# Patient Record
Sex: Female | Born: 1935
Health system: Southern US, Community
[De-identification: ages and names within clinical notes are randomized; demographics above are authoritative.]

## PROBLEM LIST (undated history)

## (undated) DIAGNOSIS — T8859XA Other complications of anesthesia, initial encounter: Secondary | ICD-10-CM

## (undated) DIAGNOSIS — I10 Essential (primary) hypertension: Secondary | ICD-10-CM

## (undated) DIAGNOSIS — E119 Type 2 diabetes mellitus without complications: Secondary | ICD-10-CM

## (undated) DIAGNOSIS — T4145XA Adverse effect of unspecified anesthetic, initial encounter: Secondary | ICD-10-CM

---
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
## 2019-08-13 ENCOUNTER — Encounter (HOSPITAL_COMMUNITY): Payer: Self-pay | Admitting: *Deleted

## 2019-08-13 ENCOUNTER — Inpatient Hospital Stay (HOSPITAL_COMMUNITY)
Admission: EM | Admit: 2019-08-13 | Discharge: 2019-08-18 | DRG: 389 | Disposition: A | Discharge status: Home Health | Payer: Medicare Other | Attending: Internal Medicine | Admitting: Internal Medicine

## 2019-08-13 ENCOUNTER — Other Ambulatory Visit: Payer: Self-pay

## 2019-08-13 ENCOUNTER — Emergency Department (HOSPITAL_COMMUNITY): Payer: Medicare Other

## 2019-08-13 DIAGNOSIS — Z7982 Long term (current) use of aspirin: Secondary | ICD-10-CM

## 2019-08-13 DIAGNOSIS — R140 Abdominal distension (gaseous): Secondary | ICD-10-CM

## 2019-08-13 DIAGNOSIS — Z66 Do not resuscitate: Secondary | ICD-10-CM | POA: Diagnosis present

## 2019-08-13 DIAGNOSIS — K56699 Other intestinal obstruction unspecified as to partial versus complete obstruction: Principal | ICD-10-CM | POA: Diagnosis present

## 2019-08-13 DIAGNOSIS — N179 Acute kidney failure, unspecified: Secondary | ICD-10-CM | POA: Diagnosis present

## 2019-08-13 DIAGNOSIS — Z7984 Long term (current) use of oral hypoglycemic drugs: Secondary | ICD-10-CM | POA: Diagnosis not present

## 2019-08-13 DIAGNOSIS — K635 Polyp of colon: Secondary | ICD-10-CM | POA: Diagnosis present

## 2019-08-13 DIAGNOSIS — D509 Iron deficiency anemia, unspecified: Secondary | ICD-10-CM | POA: Diagnosis present

## 2019-08-13 DIAGNOSIS — K56609 Unspecified intestinal obstruction, unspecified as to partial versus complete obstruction: Secondary | ICD-10-CM

## 2019-08-13 DIAGNOSIS — E1122 Type 2 diabetes mellitus with diabetic chronic kidney disease: Secondary | ICD-10-CM | POA: Diagnosis present

## 2019-08-13 DIAGNOSIS — Z7189 Other specified counseling: Secondary | ICD-10-CM | POA: Diagnosis not present

## 2019-08-13 DIAGNOSIS — D649 Anemia, unspecified: Secondary | ICD-10-CM

## 2019-08-13 DIAGNOSIS — Z90710 Acquired absence of both cervix and uterus: Secondary | ICD-10-CM

## 2019-08-13 DIAGNOSIS — N189 Chronic kidney disease, unspecified: Secondary | ICD-10-CM | POA: Diagnosis present

## 2019-08-13 DIAGNOSIS — Z79899 Other long term (current) drug therapy: Secondary | ICD-10-CM | POA: Diagnosis not present

## 2019-08-13 DIAGNOSIS — E119 Type 2 diabetes mellitus without complications: Secondary | ICD-10-CM

## 2019-08-13 DIAGNOSIS — Z833 Family history of diabetes mellitus: Secondary | ICD-10-CM

## 2019-08-13 DIAGNOSIS — Z20828 Contact with and (suspected) exposure to other viral communicable diseases: Secondary | ICD-10-CM | POA: Diagnosis present

## 2019-08-13 DIAGNOSIS — I129 Hypertensive chronic kidney disease with stage 1 through stage 4 chronic kidney disease, or unspecified chronic kidney disease: Secondary | ICD-10-CM | POA: Diagnosis present

## 2019-08-13 DIAGNOSIS — N289 Disorder of kidney and ureter, unspecified: Secondary | ICD-10-CM | POA: Diagnosis not present

## 2019-08-13 DIAGNOSIS — Z87891 Personal history of nicotine dependence: Secondary | ICD-10-CM

## 2019-08-13 DIAGNOSIS — Z515 Encounter for palliative care: Secondary | ICD-10-CM | POA: Diagnosis present

## 2019-08-13 DIAGNOSIS — N839 Noninflammatory disorder of ovary, fallopian tube and broad ligament, unspecified: Secondary | ICD-10-CM | POA: Diagnosis present

## 2019-08-13 DIAGNOSIS — Z90721 Acquired absence of ovaries, unilateral: Secondary | ICD-10-CM | POA: Diagnosis not present

## 2019-08-13 DIAGNOSIS — N2889 Other specified disorders of kidney and ureter: Secondary | ICD-10-CM | POA: Diagnosis present

## 2019-08-13 DIAGNOSIS — K5730 Diverticulosis of large intestine without perforation or abscess without bleeding: Secondary | ICD-10-CM | POA: Diagnosis present

## 2019-08-13 DIAGNOSIS — N390 Urinary tract infection, site not specified: Secondary | ICD-10-CM | POA: Diagnosis present

## 2019-08-13 DIAGNOSIS — R42 Dizziness and giddiness: Secondary | ICD-10-CM | POA: Diagnosis not present

## 2019-08-13 DIAGNOSIS — D72829 Elevated white blood cell count, unspecified: Secondary | ICD-10-CM

## 2019-08-13 DIAGNOSIS — C189 Malignant neoplasm of colon, unspecified: Secondary | ICD-10-CM

## 2019-08-13 HISTORY — DX: Type 2 diabetes mellitus without complications: E11.9

## 2019-08-13 HISTORY — DX: Other complications of anesthesia, initial encounter: T88.59XA

## 2019-08-13 HISTORY — DX: Essential (primary) hypertension: I10

## 2019-08-13 LAB — COMPREHENSIVE METABOLIC PANEL
ALT: 20 U/L (ref 0–44)
AST: 19 U/L (ref 15–41)
Albumin: 3.1 g/dL — ABNORMAL LOW (ref 3.5–5.0)
Alkaline Phosphatase: 47 U/L (ref 38–126)
Anion gap: 15 (ref 5–15)
BUN: 38 mg/dL — ABNORMAL HIGH (ref 8–23)
CO2: 22 mmol/L (ref 22–32)
Calcium: 8.6 mg/dL — ABNORMAL LOW (ref 8.9–10.3)
Chloride: 98 mmol/L (ref 98–111)
Creatinine, Ser: 2.24 mg/dL — ABNORMAL HIGH (ref 0.44–1.00)
GFR calc Af Amer: 22 mL/min — ABNORMAL LOW (ref >60)
GFR calc non Af Amer: 19 mL/min — ABNORMAL LOW (ref >60)
Glucose, Bld: 324 mg/dL — ABNORMAL HIGH (ref 70–99)
Potassium: 4.2 mmol/L (ref 3.5–5.1)
Sodium: 135 mmol/L (ref 135–145)
Total Bilirubin: 0.8 mg/dL (ref 0.3–1.2)
Total Protein: 6.3 g/dL — ABNORMAL LOW (ref 6.5–8.1)

## 2019-08-13 LAB — CBG MONITORING, ED: Glucose-Capillary: 281 mg/dL — ABNORMAL HIGH (ref 70–99)

## 2019-08-13 LAB — CBC
HCT: 32 % — ABNORMAL LOW (ref 36.0–46.0)
Hemoglobin: 9.3 g/dL — ABNORMAL LOW (ref 12.0–15.0)
MCH: 23.5 pg — ABNORMAL LOW (ref 26.0–34.0)
MCHC: 29.1 g/dL — ABNORMAL LOW (ref 30.0–36.0)
MCV: 80.8 fL (ref 80.0–100.0)
Platelets: 295 10*3/uL (ref 150–400)
RBC: 3.96 MIL/uL (ref 3.87–5.11)
RDW: 18.2 % — ABNORMAL HIGH (ref 11.5–15.5)
WBC: 12.2 10*3/uL — ABNORMAL HIGH (ref 4.0–10.5)
nRBC: 0 % (ref 0.0–0.2)

## 2019-08-13 LAB — URINALYSIS, ROUTINE W REFLEX MICROSCOPIC
Bacteria, UA: NONE SEEN
Bilirubin Urine: NEGATIVE
Glucose, UA: >=500 mg/dL — AB
Hgb urine dipstick: NEGATIVE
Ketones, ur: 5 mg/dL — AB
Nitrite: POSITIVE — AB
Protein, ur: 100 mg/dL — AB
Specific Gravity, Urine: 1.023 (ref 1.005–1.030)
pH: 5 (ref 5.0–8.0)

## 2019-08-13 LAB — LIPASE, BLOOD: Lipase: 23 U/L (ref 11–51)

## 2019-08-13 MED ORDER — PIPERACILLIN-TAZOBACTAM 4.5 G IVPB
4.5000 g | Freq: Once | INTRAVENOUS | Status: AC
  Administered 2019-08-13: 4.5 g via INTRAVENOUS
  Filled 2019-08-13: qty 100

## 2019-08-13 MED ORDER — PIPERACILLIN-TAZOBACTAM 4.5 G IVPB: 4.5000 g | Freq: Once | INTRAVENOUS | Status: DC

## 2019-08-13 MED ORDER — ACETAMINOPHEN 325 MG PO TABS
650.0000 mg | ORAL_TABLET | Freq: Four times a day (QID) | ORAL | Status: DC | PRN
  Administered 2019-08-13 – 2019-08-14 (×2): 650 mg via ORAL
  Filled 2019-08-13 (×2): qty 2

## 2019-08-13 MED ORDER — ENOXAPARIN SODIUM 30 MG/0.3ML ~~LOC~~ SOLN
30.0000 mg | SUBCUTANEOUS | Status: DC
  Administered 2019-08-14 – 2019-08-15 (×2): 30 mg via SUBCUTANEOUS
  Filled 2019-08-13 (×2): qty 0.3

## 2019-08-13 MED ORDER — CEPHALEXIN 250 MG PO CAPS
250.0000 mg | ORAL_CAPSULE | Freq: Two times a day (BID) | ORAL | Status: DC
  Administered 2019-08-13 – 2019-08-14 (×2): 250 mg via ORAL
  Filled 2019-08-13 (×4): qty 1

## 2019-08-13 MED ORDER — MORPHINE SULFATE (PF) 4 MG/ML IV SOLN
4.0000 mg | Freq: Once | INTRAVENOUS | Status: AC
  Administered 2019-08-13: 4 mg via INTRAVENOUS
  Filled 2019-08-13: qty 1

## 2019-08-13 MED ORDER — ONDANSETRON HCL 4 MG/2ML IJ SOLN: 4.0000 mg | Freq: Four times a day (QID) | INTRAMUSCULAR | Status: DC | PRN

## 2019-08-13 MED ORDER — ONDANSETRON HCL 4 MG PO TABS: 4.0000 mg | ORAL_TABLET | Freq: Four times a day (QID) | ORAL | Status: DC | PRN

## 2019-08-13 MED ORDER — SIMVASTATIN 20 MG PO TABS
40.0000 mg | ORAL_TABLET | Freq: Every day | ORAL | Status: DC
  Administered 2019-08-14 – 2019-08-18 (×5): 40 mg via ORAL
  Filled 2019-08-13 (×5): qty 2

## 2019-08-13 MED ORDER — PIPERACILLIN-TAZOBACTAM 3.375 G IVPB 30 MIN: 3.3750 g | Freq: Once | INTRAVENOUS | Status: DC

## 2019-08-13 MED ORDER — ASPIRIN EC 81 MG PO TBEC
81.0000 mg | DELAYED_RELEASE_TABLET | Freq: Every day | ORAL | Status: DC
  Administered 2019-08-14 – 2019-08-18 (×5): 81 mg via ORAL
  Filled 2019-08-13 (×6): qty 1

## 2019-08-13 MED ORDER — SODIUM CHLORIDE 0.9 % IV BOLUS
1000.0000 mL | Freq: Once | INTRAVENOUS | Status: AC
  Administered 2019-08-13: 1000 mL via INTRAVENOUS

## 2019-08-13 MED ORDER — POTASSIUM CHLORIDE 2 MEQ/ML IV SOLN
INTRAVENOUS | Status: AC
  Administered 2019-08-13 – 2019-08-14 (×2): via INTRAVENOUS
  Filled 2019-08-13 (×2): qty 1000

## 2019-08-13 MED ORDER — ACETAMINOPHEN 650 MG RE SUPP: 650.0000 mg | Freq: Four times a day (QID) | RECTAL | Status: DC | PRN

## 2019-08-13 MED ORDER — INSULIN ASPART 100 UNIT/ML ~~LOC~~ SOLN
0.0000 [IU] | Freq: Three times a day (TID) | SUBCUTANEOUS | Status: DC
  Administered 2019-08-14 (×2): 2 [IU] via SUBCUTANEOUS
  Administered 2019-08-14 – 2019-08-17 (×2): 1 [IU] via SUBCUTANEOUS
  Administered 2019-08-17: 2 [IU] via SUBCUTANEOUS

## 2019-08-13 MED ORDER — ONDANSETRON HCL 4 MG/2ML IJ SOLN
4.0000 mg | Freq: Once | INTRAMUSCULAR | Status: AC
  Administered 2019-08-13: 4 mg via INTRAVENOUS
  Filled 2019-08-13: qty 2

## 2019-08-13 NOTE — ED Notes (Signed)
Report given to carrie rn on 4e

## 2019-08-13 NOTE — Consult Note (Signed)
Reason for Consult: Abdominal pain Referring Physician: :Dr. Bearl Mulberry Weinberg is an 85 y.o. female.  HPI: Patient is a 85 year old female with a history of hypertension, diabetes, right renal mass, acute renal insufficiency, who comes in with abdominal pain over the last 4 days.  Patient states that pain began 4 days ago and was generalized.  She states that now it is localized to the right side of her abdomen.  She states that she has had no nausea or vomiting.  Patient states her last bowel movement was 1.5 days ago.  Patient states she is unsure when her last flatus was.  Patient denies any blood in her stool.  Patient does state that she had a previous colonoscopy in the past and was told that it was normal.  Patient is unsure of the date-there is no colonoscopy reports in epic.  Patient states that she had a previous ex lap for ovarian mass however she is unsure the date of this as well.  Upon evaluation the ER she underwent CT scan.  CT scan was consistent with a right colonic stricture.  Mass could not be ruled out.  Patient also had dilated proximal right colon.  It appears that the patient has a functioning IC valve.  Proximal right colon measuring approximate 11 cm in diameter.  I did review her CT scan personally.  Patient denies any family cancer.    Past Medical History:  Diagnosis Date  . Diabetes mellitus without complication ([place])   . Hypertension     No family history on file.  Social History:  has no history on file for tobacco, alcohol, and drug.  Allergies: No Known Allergies  Medications: I have reviewed the patient's current medications.  Results for orders placed or performed during the hospital encounter of 08/13/19 (from the past 48 hour(s))  Lipase, blood     Status: None   Collection Time: 08/13/19 11:04 AM  Result Value Ref Range   Lipase 23 11 - 51 U/L    Comment: Performed at [facility], [place] [address] [place], [place] [address]   Comprehensive metabolic panel     Status: Abnormal   Collection Time: 08/13/19 11:04 AM  Result Value Ref Range   Sodium 135 135 - 145 mmol/L   Potassium 4.2 3.5 - 5.1 mmol/L   Chloride 98 98 - 111 mmol/L   CO2 22 22 - 32 mmol/L   Glucose, Bld 324 (H) 70 - 99 mg/dL   BUN 38 (H) 8 - 23 mg/dL   Creatinine, Ser 2.24 (H) 0.44 - 1.00 mg/dL   Calcium 8.6 (L) 8.9 - 10.3 mg/dL   Total Protein 6.3 (L) 6.5 - 8.1 g/dL   Albumin 3.1 (L) 3.5 - 5.0 g/dL   AST 19 15 - 41 U/L   ALT 20 0 - 44 U/L   Alkaline Phosphatase 47 38 - 126 U/L   Total Bilirubin 0.8 0.3 - 1.2 mg/dL   GFR calc non Af Amer 19 (L) >60 mL/min   GFR calc Af Amer 22 (L) >60 mL/min   Anion gap 15 5 - 15    Comment: Performed at [facility], [place] [address], [place] [address]  CBC     Status: Abnormal   Collection Time: 08/13/19 11:04 AM  Result Value Ref Range   WBC 12.2 (H) 4.0 - 10.5 K/uL   RBC 3.96 3.87 - 5.11 MIL/uL   Hemoglobin 9.3 (L) 12.0 - 15.0 g/dL  HCT 32.0 (L) 36.0 - 46.0 %   MCV 80.8 80.0 - 100.0 fL   MCH 23.5 (L) 26.0 - 34.0 pg   MCHC 29.1 (L) 30.0 - 36.0 g/dL   RDW 18.2 (H) 11.5 - 15.5 %   Platelets 295 150 - 400 K/uL   nRBC 0.0 0.0 - 0.2 %    Comment: Performed at [place] [address], [place], [place] [address]  Urinalysis, Routine w reflex microscopic     Status: Abnormal   Collection Time: 08/13/19  1:01 PM  Result Value Ref Range   Color, Urine AMBER (A) YELLOW    Comment: BIOCHEMICALS MAY BE AFFECTED BY COLOR   APPearance CLEAR CLEAR   Specific Gravity, Urine 1.023 1.005 - 1.030   pH 5.0 5.0 - 8.0   Glucose, UA >=500 (A) NEGATIVE mg/dL   Hgb urine dipstick NEGATIVE NEGATIVE   Bilirubin Urine NEGATIVE NEGATIVE   Ketones, ur 5 (A) NEGATIVE mg/dL   Protein, ur 100 (A) NEGATIVE mg/dL   Nitrite POSITIVE (A) NEGATIVE   Leukocytes,Ua MODERATE (A) NEGATIVE   RBC / HPF 0-5 0 - 5 RBC/hpf   WBC, UA 21-50 0 - 5 WBC/hpf   Bacteria, UA NONE SEEN NONE SEEN   Squamous Epithelial  / LPF 0-5 0 - 5    Comment: Performed at [facility], [place] [address] [place], [place] [address]    Ct Abdomen Pelvis Wo Contrast  Result Date: 08/13/2019 CLINICAL DATA:  Flank pain. Right lower quadrant abdominal pain. EXAM: CT ABDOMEN AND PELVIS WITHOUT CONTRAST TECHNIQUE: Multidetector CT imaging of the abdomen and pelvis was performed following the standard protocol without IV contrast. COMPARISON:  None. FINDINGS: Lower chest: There is atelectasis versus scarring at the lung bases.The heart size is normal. Hepatobiliary: The liver is normal. Normal gallbladder.There is no biliary ductal dilation. Pancreas: Normal contours without ductal dilatation. No peripancreatic fluid collection. Spleen: No splenic laceration or hematoma. Adrenals/Urinary Tract: --Adrenal glands: No adrenal hemorrhage. --Right kidney/ureter: No hydronephrosis or perinephric hematoma. --Left kidney/ureter: There is a complex 4.9 x 3.8 cm mass arising from the upper pole the right kidney. This measures approximately 29 Hounsfield units. There is an additional smaller exophytic lesion arising from the lower pole measuring approximately 2.2 x 1.8 cm. This lesion contains a small calcification. There are no radiopaque obstructing kidney stones. No hydronephrosis. --Urinary bladder: The urinary bladder is decompressed and therefore poorly evaluated. Stomach/Bowel: --Stomach/Duodenum: No hiatal hernia or other gastric abnormality. Normal duodenal course and caliber. --Small bowel: There are dilated loops of small bowel primarily involving the ileum and distal jejunum. The small bowel loops measure up to approximately 2.8 cm in diameter. Air-fluid levels are noted. --Colon: There is sigmoid diverticulosis without CT evidence for diverticulitis. The transverse colon, descending colon, sigmoid colon, and rectum are relatively decompressed. The cecum and proximal ascending colon are significantly dilated measuring up to approximately 11  cm in diameter. There is an abrupt transition point involving the distal descending colon. --Appendix: Not visualized. No right lower quadrant inflammation or free fluid. Vascular/Lymphatic: Atherosclerotic calcification is present within the non-aneurysmal abdominal aorta, without hemodynamically significant stenosis. --No retroperitoneal lymphadenopathy. --there are few mildly prominent mesenteric lymph nodes in the right lower quadrant. --No pelvic or inguinal lymphadenopathy. Reproductive: Status post hysterectomy. No adnexal mass. Other: There is no free air. There is a trace amount of free fluid in the right pericolic gutter. The abdominal wall is normal. Musculoskeletal. No acute displaced fractures. IMPRESSION: 1.  Moderate grade obstruction at the level of the ascending colon with a distinct abrupt transition point as detailed above. This further results in dilatation of the distal small bowel. An exact cause of this abrupt transition point and obstruction is not identified. An underlying mass is not excluded. Follow-up is recommended. 2. There is a complex 4.9 x 3.8 cm mass arising from the upper pole of the right kidney. This could represent a hemorrhagic/proteinaceous cyst versus a solid mass. Further evaluation with nonemergent MRI with and without contrast is recommended. 3. Sigmoid diverticulosis without CT evidence for diverticulitis. 4. Trace amount of free fluid in the right pericolic gutter. No free air. 5. No hydronephrosis.  No radiopaque kidney stones. Aortic Atherosclerosis (ICD10-I70.0). Electronically Signed   By: Constance Holster M.D.   On: 08/13/2019 19:00    Review of Systems  Constitutional: Negative for chills, fever and malaise/fatigue.  HENT: Negative for ear discharge, hearing loss and sore throat.   Eyes: Negative for blurred vision and discharge.  Respiratory: Negative for cough and shortness of breath.   Cardiovascular: Negative for chest pain, orthopnea and leg swelling.   Gastrointestinal: Positive for abdominal pain and constipation. Negative for diarrhea, heartburn, nausea and vomiting.  Musculoskeletal: Negative for myalgias and neck pain.  Skin: Negative for itching and rash.  Neurological: Negative for dizziness, focal weakness, seizures and loss of consciousness.  Endo/Heme/Allergies: Negative for environmental allergies. Does not bruise/bleed easily.  Psychiatric/Behavioral: Negative for depression and suicidal ideas.  All other systems reviewed and are negative.  Blood pressure 120/63, pulse 85, temperature 98.5 F (36.9 C), temperature source Oral, resp. rate 16, SpO2 92 %. Physical Exam  Constitutional: She is oriented to person, place, and time. Vital signs are normal. She appears well-developed and well-nourished.  Conversant No acute distress  Eyes: Lids are normal. No scleral icterus.  No lid lag Moist conjunctiva  Neck: No tracheal tenderness present. No thyromegaly present.  No cervical lymphadenopathy  Cardiovascular: Normal rate, regular rhythm and intact distal pulses.  No murmur heard. Respiratory: Effort normal and breath sounds normal. She has no wheezes. She has no rales.  GI: Bowel sounds are normal. She exhibits distension. There is no hepatosplenomegaly. There is abdominal tenderness in the right upper quadrant and right lower quadrant. There is no rigidity, no rebound and no guarding. No hernia.  Neurological: She is alert and oriented to person, place, and time.  Normal gait and station  Skin: Skin is warm. No rash noted. No cyanosis. Nails show no clubbing.  Normal skin turgor  Psychiatric: Judgment normal.  Appropriate affect    Assessment/Plan: 85 year old female with a proximal right colon stricture versus mass. Diabetes Hypertension Acute renal insufficiency Right renal mass  1.  Had a long discussion with the patient in regards to the findings of her CT scan.  I discussed with her there is a good chance this is  likely a mass causing her stricture.  I discussed with her that her proximal right colon/cecum is very distended this could eventually lead to perforation.  Patient states that she would like to do everything possible to avoid surgery at this time.  I discussed with her if she had a perforation due to distention of her cecum which she want surgery then.  She still unsure at that time if she would want surgery. 2.  It may be reasonable to consult GI to see of a palliative colon stent can be placed.  Otherwise I think the patient would likely require palliative  colostomy if she did not want to have definitive surgery.  Ralene Ok 08/13/2019, 9:34 PM

## 2019-08-13 NOTE — ED Provider Notes (Signed)
  Physical Exam  BP (!) 119/50   Pulse 81   Temp 98.5 F (36.9 C) (Oral)   Resp 20   SpO2 93%   Physical Exam  ED Course/Procedures   Clinical Course as of Aug 13 1911  Fri Aug 13, 2019  1656 Patient signed out to evening provider team.  Pending CT scan and disposition following scan   [MT]    Clinical Course User Index [MT] Langston Masker Carola Rhine, MD    Procedures  MDM  Received patient in signout.  Abdominal pain nausea and vomiting.  CT scan showed bowel obstruction at the level of the ascending colon.  Patient states she has had some problems with the colon before and had a colonoscopy but did not show anything.  Also renal mass.  Patient states she knows about this mass and they are thinking it may be cancer however she was told she may not be able to do anesthesia to get anything done with it.  This was done in [place].  Urine also shows potential infection.  Patient is just visiting family member.  Will admit to unassigned internal medicine.      Davonna Belling, MD 08/13/19 [phone]

## 2019-08-13 NOTE — H&P (Signed)
Date: 08/13/2019               Patient Name:  Mallory Mcpherson MRN: [id number]  DOB: 22-Mar-1929 Age / Sex: 85 y.o., female   PCP: System, Pcp Not In         Medical Service: Internal Medicine Teaching Service         Attending Physician: Dr. Davonna Belling, MD    First Contact: Dr. Sheppard Coil Pager: [phone]  Second Contact: Dr. Berline Lopes Pager: [phone]       After Hours (After 5p/  First Contact Pager: [phone]  weekends / holidays): Second Contact Pager: [phone]   Chief Complaint: Abdominal Distention   History of Present Illness: Mallory Mcpherson is a 85 y.o female with CKD, DM, HTN, and vertigo who presented to the ED with abdominal distention. History was obtained via the patient and through chart review.   The patient is originally from [place] and is in town visiting family. 4 days ago she noticed abrupt onset abdominal distention that has been progressive and is now causing abdominal pain. She has not had a bowel movement in 2 days or passed flatus in 2 days. She relates these bowel changes to not eating much food. Her decreased PO intake is not due to nausea. She has not had much nausea aside from when she initially got to the ED. She had a colonoscopy several years ago but does not recall when exactly it was. She states that it was unremarkable at the time. She had a ovarian cyst and underwent a oophorectomy followed by a hysterectomy a couple years later. History of a left kidney mass that she has been seen for and told that it may be cancer but during a prior surgery had an adverse effect due to anesthesia and was told she should be cautious with further surgeries.  Functionally she lives a sedentary life. She has been retired for several years. She use to work in her garden but now is unable to ambulate or work very long before she has to rest due to fatigue. She denies DOE, chest pain, or other anginal symptoms. She has never had a MI. She had an echo a couple years ago  and was told her heart was functioning well.   No fevers, headaches, belching, significantly N/V, cough, SHOB, diarrhea, bloody stools. Endorses polyuria and nocturia (3x per night).  Meds:  Current Meds  Medication Sig  . aspirin EC 81 MG tablet Take 81 mg by mouth daily.  . cephALEXin (KEFLEX) 500 MG capsule Take 500 mg by mouth 2 (two) times daily.  . Cholecalciferol (VITAMIN D3 PO) Take 1 tablet by mouth daily.  . enalapril (VASOTEC) 10 MG tablet Take 10 mg by mouth at bedtime.  Marland Kitchen glimepiride (AMARYL) 2 MG tablet Take 2 mg by mouth 2 (two) times daily.  . meclizine (ANTIVERT) 25 MG tablet Take 25 mg by mouth 3 (three) times daily as needed for dizziness.  . Multiple Vitamin (MULTIVITAMIN WITH MINERALS) TABS tablet Take 1 tablet by mouth daily.  . simvastatin (ZOCOR) 40 MG tablet Take 40 mg by mouth daily.  . sitaGLIPtin (JANUVIA) 25 MG tablet Take 25 mg by mouth daily.   Allergies: Allergies as of 08/13/2019  . (No Known Allergies)   Past Medical History:  Diagnosis Date  . Diabetes mellitus without complication ([place])   . Hypertension     Family History:  Grandmother + DM Denies a FHx of malignancy or heart disease  Social  History:  From [place]. Previous worked at a nursing home for 30 years. Now retired and lives a sedentary life but she does have a vegetable garden. She has a sister and brother who live in [place]. Previous smoker but quit along time ago, >20 years ago. Denies the use of EtOH or illicit medications.   Review of Systems: A complete ROS was negative except as per HPI.   Physical Exam: Blood pressure 120/63, pulse 85, temperature 98.5 F (36.9 C), temperature source Oral, resp. rate 16, SpO2 92 %. Physical Exam  Constitutional: She is oriented to person, place, and time and well-developed, well-nourished, and in no distress.  Eyes: EOM are normal.  Neck: Normal range of motion.  Cardiovascular: Normal rate, regular rhythm, normal heart sounds  and intact distal pulses. Exam reveals no gallop and no friction rub.  No murmur heard. Pulmonary/Chest: Effort normal and breath sounds normal. No respiratory distress.  Abdominal: She exhibits distension. Bowel sounds are hypoactive. There is abdominal tenderness in the right upper quadrant and right lower quadrant.  Musculoskeletal:        General: No edema.  Neurological: She is alert and oriented to person, place, and time.  Skin: Skin is warm and dry.     CT abdomen/pelvis w/out contrast  1. Moderate grade obstruction at the level of the ascending colon with a distinct abrupt transition point as detailed above. This further results in dilatation of the distal small bowel. An exact cause of this abrupt transition point and obstruction is not identified. An underlying mass is not excluded. Follow-up is recommended. 2. There is a complex 4.9 x 3.8 cm mass arising from the upper pole of the right kidney. This could represent a hemorrhagic/proteinaceous cyst versus a solid mass. Further evaluation with nonemergent MRI with and without contrast is recommended. 3. Sigmoid diverticulosis without CT evidence for diverticulitis. 4. Trace amount of free fluid in the right pericolic gutter. No free air. 5. No hydronephrosis.  No radiopaque kidney stones.  Assessment & Plan by Problem: Principal Problem:   Bowel obstruction (HCC) Active Problems:   Diabetes ([place])   Renal insufficiency   Leukocytosis   Anemia   Renal mass, right   Mallory Mcpherson is a 85 y.o female with CKD, DM, HTN, and vertigo who presented to the ED with moderate grade obstruction of the ascending colon with distinct transition point. The obstructions is possibly due to previous abdominal surgery or kidney mass suspicious for malignancy. She is currently anemic with no previous history of anemia. It has been many years since her last colonoscopy.   Received Zosyn and a liter of fluids in the ED.   Plan: 1.  Bowel  obstruction: - Made patient NPO  - Per Surgery consult, patient will likely need surgery to relieve the obstruction.  - May need to consult GI tomorrow for colonoscopy and recomendations regarding palliative colon stent - Ordered CEA - Ondansetron for nausea  3. Anemia: - Ferritin, Iron and TIBC ordered  4.  Diabetes: - HgA1c ordered - Aspart sensitive sliding scale  5.  Renal insufficiency:  - Unknown Cr baseline.    Diet: NPO VTE ppx:  CODE STATUS: DNR IVF: Lactated Ringers 75 ml/hr  Dispo: Admit patient to Inpatient with expected length of stay greater than 2 midnights.  Signed: Marianna Payment, MD 08/13/2019, 9:18 PM

## 2019-08-13 NOTE — ED Notes (Signed)
No pain at present 

## 2019-08-13 NOTE — ED Triage Notes (Signed)
Pt to ER for evaluation of lower abdominal pain, nausea, without vomiting, denies diarrhea. Reports poor po intake. Was seen at a walk in clinic yesterday and given treatment for UTI. Reports was supposed to come over for a CT scan yesterday but didn't want to. Pain is worse today.

## 2019-08-13 NOTE — ED Provider Notes (Signed)
[place] EMERGENCY DEPARTMENT Provider Note   CSN: [id number] Arrival date & time: 08/13/19  1041     History   Chief Complaint Chief Complaint  Patient presents with  . Abdominal Pain    HPI Mallory Mcpherson is a 85 y.o. female past medical history of hypertension presenting the emergency department abdominal pain and nausea.  She reports onset of right lower sided abdominal pain approximately 2 to 3 to 4 days ago, which was gradual onset, and has been constant since then.  Reports having some dysuria.  She went to urgent care where she was diagnosed with UTI and placed on Keflex.  She has taken this for 1 day, but feels like her symptoms are worsening.  She reports significant nausea and inability to keep down any food.  She reports she has been able to keep down fluids.  She denies any diarrhea.  She does her last bowel movements probably 2 days ago.  She does not think she is been passing gas since then.  She feels her abdomen is distended.  She has no prior history of small bowel obstruction, but does report a history of hysterectomy in the 1970s.  She has had no other abdominal surgeries.  She denies any drug allergies.     HPI  No past medical history on file.  There are no active problems to display for this patient.    OB History   No obstetric history on file.      Home Medications    Prior to Admission medications   Medication Sig Start Date End Date Taking? Authorizing Provider  aspirin EC 81 MG tablet Take 81 mg by mouth daily.   Yes [provider]  cephALEXin (KEFLEX) 500 MG capsule Take 500 mg by mouth 2 (two) times daily.   Yes [provider]  Cholecalciferol (VITAMIN D3 PO) Take 1 tablet by mouth daily.   Yes [provider]  enalapril (VASOTEC) 10 MG tablet Take 10 mg by mouth at bedtime.   Yes [provider]  glimepiride (AMARYL) 2 MG tablet Take 2 mg by mouth 2 (two) times daily.   Yes [provider]  meclizine (ANTIVERT) 25 MG tablet Take 25 mg by mouth 3 (three) times daily as needed for dizziness.   Yes [provider]  Multiple Vitamin (MULTIVITAMIN WITH MINERALS) TABS tablet Take 1 tablet by mouth daily.   Yes [provider]  simvastatin (ZOCOR) 40 MG tablet Take 40 mg by mouth daily.   Yes [provider]  sitaGLIPtin (JANUVIA) 25 MG tablet Take 25 mg by mouth daily.   Yes [provider]    Family History No family history on file.  Social History Social History   Tobacco Use  . Smoking status: Not on file  Substance Use Topics  . Alcohol use: Not on file  . Drug use: Not on file     Allergies   Patient has no known allergies.   Review of Systems Review of Systems  Constitutional: Positive for appetite change. Negative for chills and fever.  Respiratory: Negative for cough and shortness of breath.   Cardiovascular: Negative for chest pain and palpitations.  Gastrointestinal: Positive for abdominal distention, abdominal pain, nausea and vomiting.  Genitourinary: Positive for dysuria. Negative for frequency and hematuria.  Skin: Negative for pallor and rash.  Neurological: Negative for seizures and syncope.  Psychiatric/Behavioral: Negative for agitation and confusion.  All other systems reviewed and are negative.  Physical Exam Updated Vital Signs BP (!) 116/57   Pulse 87   Temp 98.5 F (36.9 C) (Oral)   Resp 16   SpO2 91%   Physical Exam Vitals signs and nursing note reviewed.  Constitutional:      Appearance: She is well-developed.     Comments: Appears tired, holding vomit bag  HENT:     Head: Normocephalic and atraumatic.  Eyes:     Conjunctiva/sclera: Conjunctivae normal.  Neck:     Musculoskeletal: Neck supple.  Cardiovascular:     Rate and Rhythm: Normal rate and regular rhythm.     Heart sounds: Normal heart sounds.  Pulmonary:     Effort: Pulmonary effort is normal. No respiratory  distress.     Breath sounds: Normal breath sounds.  Abdominal:     General: Abdomen is protuberant. Bowel sounds are decreased. There is distension. There is no abdominal bruit.     Palpations: Abdomen is soft.     Tenderness: There is generalized abdominal tenderness. Negative signs include Murphy's sign, Rovsing's sign, McBurney's sign and psoas sign.  Skin:    General: Skin is warm and dry.  Neurological:     Mental Status: She is alert.      ED Treatments / Results  Labs (all labs ordered are listed, but only abnormal results are displayed) Labs Reviewed  COMPREHENSIVE METABOLIC PANEL - Abnormal; Notable for the following components:      Result Value   Glucose, Bld 324 (*)    BUN 38 (*)    Creatinine, Ser 2.24 (*)    Calcium 8.6 (*)    Total Protein 6.3 (*)    Albumin 3.1 (*)    GFR calc non Af Amer 19 (*)    GFR calc Af Amer 22 (*)    All other components within normal limits  CBC - Abnormal; Notable for the following components:   WBC 12.2 (*)    Hemoglobin 9.3 (*)    HCT 32.0 (*)    MCH 23.5 (*)    MCHC 29.1 (*)    RDW 18.2 (*)    All other components within normal limits  URINALYSIS, ROUTINE W REFLEX MICROSCOPIC - Abnormal; Notable for the following components:   Color, Urine AMBER (*)    Glucose, UA >=500 (*)    Ketones, ur 5 (*)    Protein, ur 100 (*)    Nitrite POSITIVE (*)    Leukocytes,Ua MODERATE (*)    All other components within normal limits  URINE CULTURE  SARS CORONAVIRUS 2 (TAT 6-24 HRS)  LIPASE, BLOOD    EKG None  Radiology Ct Abdomen Pelvis Wo Contrast  Result Date: 08/13/2019 CLINICAL DATA:  Flank pain. Right lower quadrant abdominal pain. EXAM: CT ABDOMEN AND PELVIS WITHOUT CONTRAST TECHNIQUE: Multidetector CT imaging of the abdomen and pelvis was performed following the standard protocol without IV contrast. COMPARISON:  None. FINDINGS: Lower chest: There is atelectasis versus scarring at the lung bases.The heart size is normal.  Hepatobiliary: The liver is normal. Normal gallbladder.There is no biliary ductal dilation. Pancreas: Normal contours without ductal dilatation. No peripancreatic fluid collection. Spleen: No splenic laceration or hematoma. Adrenals/Urinary Tract: --Adrenal glands: No adrenal hemorrhage. --Right kidney/ureter: No hydronephrosis or perinephric hematoma. --Left kidney/ureter: There is a complex 4.9 x 3.8 cm mass arising from the upper pole the right kidney. This measures approximately 29 Hounsfield units. There is an additional smaller exophytic lesion arising from the lower pole measuring approximately 2.2 x 1.8 cm. This  lesion contains a small calcification. There are no radiopaque obstructing kidney stones. No hydronephrosis. --Urinary bladder: The urinary bladder is decompressed and therefore poorly evaluated. Stomach/Bowel: --Stomach/Duodenum: No hiatal hernia or other gastric abnormality. Normal duodenal course and caliber. --Small bowel: There are dilated loops of small bowel primarily involving the ileum and distal jejunum. The small bowel loops measure up to approximately 2.8 cm in diameter. Air-fluid levels are noted. --Colon: There is sigmoid diverticulosis without CT evidence for diverticulitis. The transverse colon, descending colon, sigmoid colon, and rectum are relatively decompressed. The cecum and proximal ascending colon are significantly dilated measuring up to approximately 11 cm in diameter. There is an abrupt transition point involving the distal descending colon. --Appendix: Not visualized. No right lower quadrant inflammation or free fluid. Vascular/Lymphatic: Atherosclerotic calcification is present within the non-aneurysmal abdominal aorta, without hemodynamically significant stenosis. --No retroperitoneal lymphadenopathy. --there are few mildly prominent mesenteric lymph nodes in the right lower quadrant. --No pelvic or inguinal lymphadenopathy. Reproductive: Status post hysterectomy. No  adnexal mass. Other: There is no free air. There is a trace amount of free fluid in the right pericolic gutter. The abdominal wall is normal. Musculoskeletal. No acute displaced fractures. IMPRESSION: 1. Moderate grade obstruction at the level of the ascending colon with a distinct abrupt transition point as detailed above. This further results in dilatation of the distal small bowel. An exact cause of this abrupt transition point and obstruction is not identified. An underlying mass is not excluded. Follow-up is recommended. 2. There is a complex 4.9 x 3.8 cm mass arising from the upper pole of the right kidney. This could represent a hemorrhagic/proteinaceous cyst versus a solid mass. Further evaluation with nonemergent MRI with and without contrast is recommended. 3. Sigmoid diverticulosis without CT evidence for diverticulitis. 4. Trace amount of free fluid in the right pericolic gutter. No free air. 5. No hydronephrosis.  No radiopaque kidney stones. Aortic Atherosclerosis (ICD10-I70.0). Electronically Signed   By: Constance Holster M.D.   On: 08/13/2019 19:00    Procedures Procedures (including critical care time)  Medications Ordered in ED Medications  sodium chloride 0.9 % bolus 1,000 mL (1,000 mLs Intravenous New Bag/Given 08/13/19 1647)  morphine 4 MG/ML injection 4 mg (4 mg Intravenous Given 08/13/19 1648)  ondansetron (ZOFRAN) injection 4 mg (4 mg Intravenous Given 08/13/19 1648)  piperacillin-tazobactam (ZOSYN) IVPB 4.5 g (0 g Intravenous Stopped 08/13/19 1718)     Initial Impression / Assessment and Plan / ED Course  I have reviewed the triage vital signs and the nursing notes.  Pertinent labs & imaging results that were available during my care of the patient were reviewed by me and considered in my medical decision making (see chart for details).  85 year old female presenting the emergency department for right lower quadrant abdominal pain and dysuria for several days.  Also noting  some nausea and poor p.o. intake.  Differential is broad and includes appendicitis versus viral gastroenteritis versus cystitis versus pyelonephritis versus kidney stone  Another possibility is a bowel obstruction given her abdominal distention.  We will give her IV fluids and cover her broadly with IV Zosyn for both urinary tract infections and possible intra-abdominal infection.  She does not meet sepsis criteria upon arrival.   Her creatinine level and intake is elevated to 2.2.  She would not be able to get IV contrast for her scan will check to dry scanner.  She cannot tolerate p.o. at this time. Clinical Course as of Aug 12 1944  Fri  Aug 13, 2019  1656 Patient signed out to evening provider team.  Pending CT scan and disposition following scan   [MT]    Clinical Course User Index [MT] Langston Masker Carola Rhine, MD     Final Clinical Impressions(s) / ED Diagnoses   Final diagnoses:  Intestinal obstruction, unspecified cause, unspecified whether partial or complete [facility])  Renal insufficiency    ED Discharge Orders    None       Wyvonnia Dusky, MD 08/13/19 1946

## 2019-08-14 ENCOUNTER — Inpatient Hospital Stay (HOSPITAL_COMMUNITY): Payer: Medicare Other

## 2019-08-14 DIAGNOSIS — R42 Dizziness and giddiness: Secondary | ICD-10-CM

## 2019-08-14 DIAGNOSIS — I129 Hypertensive chronic kidney disease with stage 1 through stage 4 chronic kidney disease, or unspecified chronic kidney disease: Secondary | ICD-10-CM

## 2019-08-14 DIAGNOSIS — K56609 Unspecified intestinal obstruction, unspecified as to partial versus complete obstruction: Secondary | ICD-10-CM

## 2019-08-14 DIAGNOSIS — E1122 Type 2 diabetes mellitus with diabetic chronic kidney disease: Secondary | ICD-10-CM

## 2019-08-14 DIAGNOSIS — D649 Anemia, unspecified: Secondary | ICD-10-CM

## 2019-08-14 DIAGNOSIS — Z66 Do not resuscitate: Secondary | ICD-10-CM

## 2019-08-14 DIAGNOSIS — Z87891 Personal history of nicotine dependence: Secondary | ICD-10-CM

## 2019-08-14 DIAGNOSIS — N189 Chronic kidney disease, unspecified: Secondary | ICD-10-CM

## 2019-08-14 DIAGNOSIS — Z90721 Acquired absence of ovaries, unilateral: Secondary | ICD-10-CM

## 2019-08-14 DIAGNOSIS — Z90710 Acquired absence of both cervix and uterus: Secondary | ICD-10-CM

## 2019-08-14 DIAGNOSIS — N2889 Other specified disorders of kidney and ureter: Secondary | ICD-10-CM

## 2019-08-14 LAB — BASIC METABOLIC PANEL
Anion gap: 10 (ref 5–15)
BUN: 50 mg/dL — ABNORMAL HIGH (ref 8–23)
CO2: 28 mmol/L (ref 22–32)
Calcium: 7.9 mg/dL — ABNORMAL LOW (ref 8.9–10.3)
Chloride: 98 mmol/L (ref 98–111)
Creatinine, Ser: 2.57 mg/dL — ABNORMAL HIGH (ref 0.44–1.00)
GFR calc Af Amer: 19 mL/min — ABNORMAL LOW (ref >60)
GFR calc non Af Amer: 16 mL/min — ABNORMAL LOW (ref >60)
Glucose, Bld: 275 mg/dL — ABNORMAL HIGH (ref 70–99)
Potassium: 4.3 mmol/L (ref 3.5–5.1)
Sodium: 136 mmol/L (ref 135–145)

## 2019-08-14 LAB — URINE CULTURE: Culture: NO GROWTH

## 2019-08-14 LAB — CBC
HCT: 27.7 % — ABNORMAL LOW (ref 36.0–46.0)
Hemoglobin: 8.3 g/dL — ABNORMAL LOW (ref 12.0–15.0)
MCH: 23.7 pg — ABNORMAL LOW (ref 26.0–34.0)
MCHC: 30 g/dL (ref 30.0–36.0)
MCV: 79.1 fL — ABNORMAL LOW (ref 80.0–100.0)
Platelets: 249 10*3/uL (ref 150–400)
RBC: 3.5 MIL/uL — ABNORMAL LOW (ref 3.87–5.11)
RDW: 18.1 % — ABNORMAL HIGH (ref 11.5–15.5)
WBC: 6.9 10*3/uL (ref 4.0–10.5)
nRBC: 0 % (ref 0.0–0.2)

## 2019-08-14 LAB — GLUCOSE, CAPILLARY
Glucose-Capillary: 195 mg/dL — ABNORMAL HIGH (ref 70–99)
Glucose-Capillary: 185 mg/dL — ABNORMAL HIGH (ref 70–99)
Glucose-Capillary: 150 mg/dL — ABNORMAL HIGH (ref 70–99)
Glucose-Capillary: 146 mg/dL — ABNORMAL HIGH (ref 70–99)
Glucose-Capillary: 120 mg/dL — ABNORMAL HIGH (ref 70–99)

## 2019-08-14 LAB — HEMOGLOBIN A1C
Hgb A1c MFr Bld: 9.3 % — ABNORMAL HIGH (ref 4.8–5.6)
Mean Plasma Glucose: 220.21 mg/dL

## 2019-08-14 LAB — IRON AND TIBC
Iron: 9 ug/dL — ABNORMAL LOW (ref 28–170)
Saturation Ratios: 3 % — ABNORMAL LOW (ref 10.4–31.8)
TIBC: 304 ug/dL (ref 250–450)
UIBC: 295 ug/dL

## 2019-08-14 LAB — FERRITIN: Ferritin: 29 ng/mL (ref 11–307)

## 2019-08-14 LAB — SARS CORONAVIRUS 2 (TAT 6-24 HRS): SARS Coronavirus 2: NEGATIVE

## 2019-08-14 MED ORDER — SODIUM CHLORIDE 0.9 % IV SOLN
510.0000 mg | Freq: Once | INTRAVENOUS | Status: AC
  Administered 2019-08-14: 14:00:00 510 mg via INTRAVENOUS
  Filled 2019-08-14: qty 17

## 2019-08-14 MED ORDER — HYDROMORPHONE HCL 1 MG/ML IJ SOLN
0.2500 mg | INTRAMUSCULAR | Status: DC | PRN
  Administered 2019-08-14 – 2019-08-15 (×2): 0.25 mg via INTRAVENOUS
  Filled 2019-08-14 (×2): qty 1

## 2019-08-14 NOTE — Progress Notes (Signed)
[facility] Office:  [phone] General Surgery Progress Note   LOS: 1 day  POD -     Chief Complaint: Abdominal pain  Assessment and Plan: 1.  Ascending colon stricture (?mass) with significant dilation of the right colon  Keep NPO.  As I understand, GI has been consulted - Dr. Rosalie Gums, for input about a possible stent.  If stent does not work or not possible, she'll need surgery   2. DM  HgbA1C - 9.3 - 08/14/2019 3.  HTN 4.  Right renal mass 5.  ARF  Creatinine - 2.57 - 08/14/2019 6. Anemia  Hgb - 8.3 - 08/14/2019 7.  DVT prophylaxis - Lovenox   Principal Problem:   Bowel obstruction (HCC) Active Problems:   Diabetes (Person)   Renal insufficiency   Leukocytosis   Anemia   Renal mass, right   Subjective:  She has right sided abdominal pain.  She lives in [place].  She is here visiting her sister, Eugenia Mcalpine, with plans to move here (?).  I called and left [facility] a message on her phone.  She did not answer.  She has a daughter, Anani Shevchenko, in [place], but no phone number.  Objective:   Vitals:   08/14/19 0545 08/14/19 0821  BP: 112/65 104/61  Pulse: 97 89  Resp: 19 20  Temp: 98.6 F (37 C)   SpO2: 99% 98%     Intake/Output from previous day:  09/18 0701 - 09/19 0700 In: 1612.7 [P.O.:100; I.V.:1412.7; IV Piggyback:100] Out: -   Intake/Output this shift:  No intake/output data recorded.   Physical Exam:   General: Obese WF who is alert and oriented.    HEENT: Normal. Pupils equal.   Heart:  Has a 2/6 systolic murmur .   Lungs: Clear   Abdomen: Tender right abdomen.   Lab Results:    Recent Labs    08/13/19 1104 08/14/19 0107  WBC 12.2* 6.9  HGB 9.3* 8.3*  HCT 32.0* 27.7*  PLT 295 249    BMET   Recent Labs    08/13/19 1104 08/14/19 0107  NA 135 136  K 4.2 4.3  CL 98 98  CO2 22 28  GLUCOSE 324* 275*  BUN 38* 50*  CREATININE 2.24* 2.57*  CALCIUM 8.6* 7.9*    PT/INR  No results for input(s): LABPROT, INR in the  last 72 hours.  ABG  No results for input(s): PHART, HCO3 in the last 72 hours.  Invalid input(s): PCO2, PO2   Studies/Results:  Ct Abdomen Pelvis Wo Contrast  Result Date: 08/13/2019 CLINICAL DATA:  Flank pain. Right lower quadrant abdominal pain. EXAM: CT ABDOMEN AND PELVIS WITHOUT CONTRAST TECHNIQUE: Multidetector CT imaging of the abdomen and pelvis was performed following the standard protocol without IV contrast. COMPARISON:  None. FINDINGS: Lower chest: There is atelectasis versus scarring at the lung bases.The heart size is normal. Hepatobiliary: The liver is normal. Normal gallbladder.There is no biliary ductal dilation. Pancreas: Normal contours without ductal dilatation. No peripancreatic fluid collection. Spleen: No splenic laceration or hematoma. Adrenals/Urinary Tract: --Adrenal glands: No adrenal hemorrhage. --Right kidney/ureter: No hydronephrosis or perinephric hematoma. --Left kidney/ureter: There is a complex 4.9 x 3.8 cm mass arising from the upper pole the right kidney. This measures approximately 29 Hounsfield units. There is an additional smaller exophytic lesion arising from the lower pole measuring approximately 2.2 x 1.8 cm. This lesion contains a small calcification. There are no radiopaque obstructing kidney stones. No hydronephrosis. --Urinary bladder: The urinary bladder is  decompressed and therefore poorly evaluated. Stomach/Bowel: --Stomach/Duodenum: No hiatal hernia or other gastric abnormality. Normal duodenal course and caliber. --Small bowel: There are dilated loops of small bowel primarily involving the ileum and distal jejunum. The small bowel loops measure up to approximately 2.8 cm in diameter. Air-fluid levels are noted. --Colon: There is sigmoid diverticulosis without CT evidence for diverticulitis. The transverse colon, descending colon, sigmoid colon, and rectum are relatively decompressed. The cecum and proximal ascending colon are significantly dilated measuring  up to approximately 11 cm in diameter. There is an abrupt transition point involving the distal descending colon. --Appendix: Not visualized. No right lower quadrant inflammation or free fluid. Vascular/Lymphatic: Atherosclerotic calcification is present within the non-aneurysmal abdominal aorta, without hemodynamically significant stenosis. --No retroperitoneal lymphadenopathy. --there are few mildly prominent mesenteric lymph nodes in the right lower quadrant. --No pelvic or inguinal lymphadenopathy. Reproductive: Status post hysterectomy. No adnexal mass. Other: There is no free air. There is a trace amount of free fluid in the right pericolic gutter. The abdominal wall is normal. Musculoskeletal. No acute displaced fractures. IMPRESSION: 1. Moderate grade obstruction at the level of the ascending colon with a distinct abrupt transition point as detailed above. This further results in dilatation of the distal small bowel. An exact cause of this abrupt transition point and obstruction is not identified. An underlying mass is not excluded. Follow-up is recommended. 2. There is a complex 4.9 x 3.8 cm mass arising from the upper pole of the right kidney. This could represent a hemorrhagic/proteinaceous cyst versus a solid mass. Further evaluation with nonemergent MRI with and without contrast is recommended. 3. Sigmoid diverticulosis without CT evidence for diverticulitis. 4. Trace amount of free fluid in the right pericolic gutter. No free air. 5. No hydronephrosis.  No radiopaque kidney stones. Aortic Atherosclerosis (ICD10-I70.0). Electronically Signed   By: Constance Holster M.D.   On: 08/13/2019 19:00     Anti-infectives:   Anti-infectives (From admission, onward)   Start     Dose/Rate Route Frequency Ordered Stop   08/13/19 2200  cephALEXin (KEFLEX) capsule 250 mg     250 mg Oral 2 times daily 08/13/19 2119 08/16/19 2159   08/13/19 1545  piperacillin-tazobactam (ZOSYN) IVPB 4.5 g  Status:   Discontinued     4.5 g 200 mL/hr over 30 Minutes Intravenous  Once 08/13/19 1541 08/13/19 1543   08/13/19 1545  piperacillin-tazobactam (ZOSYN) IVPB 4.5 g     4.5 g 200 mL/hr over 30 Minutes Intravenous  Once 08/13/19 1544 08/13/19 1718   08/13/19 1545  piperacillin-tazobactam (ZOSYN) IVPB 3.375 g  Status:  Discontinued     3.375 g 100 mL/hr over 30 Minutes Intravenous  Once 08/13/19 1544 08/13/19 1938      Alphonsa Overall, MD, FACS Pager: [facility] Office: [phone] 08/14/2019

## 2019-08-14 NOTE — Progress Notes (Signed)
   Subjective: Patient stated that abdominal pain has remained unremitting overnight.  She continues to lack of desire to eat.  While lying on her right side she feels some moderate relief.  Today we discussed the possible therapies including a hypothetical procedure with gastroenterology if such is available.  She would like to avoid surgery if at all possible but is ultimately amenable to such if it is necessary.   Objective:  Vital signs in last 24 hours: Vitals:   08/13/19 2100 08/13/19 2145 08/13/19 2306 08/14/19 0545  BP: (!) 121/59 (!) 109/54 (!) 124/56 112/65  Pulse: 88 86 82 97  Resp:   19 19  Temp:   98.4 F (36.9 C) 98.6 F (37 C)  TempSrc:   Oral Oral  SpO2: (!) 86% (!) 88% 100% 99%  Weight:   83.8 kg   Height:   5\' 3"  (1.6 m)    General: A/O x4, in no acute distress, afebrile, nondiaphoretic HEENT: PEERL, EMO intact Cardio: RRR, no mrg's  Pulmonary: CTA bilaterally, no wheezing or crackles  Abdomen: Bowel sounds decreased, abdomen tender to palpation MSK: BLE nontender, nonedematous Psych: Appropriate affect, not depressed in appearance, engages well  Assessment/Plan:  Principal Problem:   Bowel obstruction (HCC) Active Problems:   Diabetes ([place])   Renal insufficiency   Leukocytosis   Anemia   Renal mass, right  Mallory Mcpherson is an 85 yo female w/ a PMHx notable for CKD, DMII, HTN and vertigo who presented to the ED with abdominal pain distention.  She was noted to have moderate grade obstruction of ascending colon with a distinct transition point which is likely secondary to her prior abdominal surgery and your kidney mass suspicious for malignancy.  She was seen and evaluated by general surgery who recommended consultation of GI for consideration of a palliative stent as the patient would like to defer surgery if at all possible.  A/P: Large bowel obstruction: Patient remains n.p.o., general surgery is on board, we appreciate their assistance and  recommendations. GI has agreed to see the patient and discuss any potential options today.  She will likely need surgery at some point even if she elects to take a palliative approach.  The patient ultimately admitted today that she is potentially agreeable to surgery if there is no other option remaining. Vitals stable.  -No stool output today, abd distended and tender -Continue n.p.o. status -Continue Zofran for nausea -Follow surgical/GI recommendations  Anemia: Ferritin and iron studies in conjunction with MCV are consistent with mild iron deficiency anemia.  No prior history of such per chart - We will order FOBT - Transfuse 1 unit Feraheme   Diabetes mellitus type 2: Hemoglobin A1c 9.3. Continue SSI insulin.  Diet: NPO VTE ppx: Enoxaparin Code: DNR IVF: LR 59ml/hr Dispo: Anticipated discharge in approximately 2-3 day(s).   Kathi Ludwig, MD 08/14/2019, 6:49 AM Pager: # [phone]

## 2019-08-14 NOTE — Consult Note (Signed)
Referring Provider:  Dr. Donalee Citrin Primary Care Physician:  System, Pcp Not In Primary Gastroenterologist: Althia Forts  Reason for Consultation: Colonic obstruction.  Evaluate for possible stent placement  HPI: Mallory Mcpherson is a 85 y.o. female with past medical history of exploratory laparotomy long time ago for possible ovarian mass, history of right renal mass and diabetes presented to the hospital with worsening abdominal pain of 4 to 5 days duration.  CT abdomen pelvis without contrast yesterday showed relatively decompressed transverse colon, descending colon, sigmoid colon and rectum.  Cecum and proximal ascending colon significantly dilated up to 11 cm.  There is abrupt transition point involving the distal ascending colon.  Also showed 4.9 cm complex right kidney cyst versus solid mass.  Patient has been seen by surgical team.  She does not want any surgical intervention.  GI is consulted for evaluation of possible palliative stent placement.  Patient seen and examined at bedside.  History obtained with help of daughter.  According to patient she started having abdominal pain around 5 days ago but according to patient's daughter, she has been having abdominal pain for last 5 weeks.  Complaining of intermittent constipation.  Last bowel movement 2 days ago.  Not passing gas.  Complaining of nausea and vomiting.  Apparently had a CT scan in June 2020 for follow-up on kidney cyst which did not showed any acute chronic issue at that time.  Last colonoscopy many years ago.  Report not available to review.  No family history of colon cancer.  She denies any significant weight loss.  Complaining of decreased appetite  Past Medical History:  Diagnosis Date  . Diabetes mellitus without complication ([place])   . Hypertension      Prior to Admission medications   Medication Sig Start Date End Date Taking? Authorizing Provider  aspirin EC 81 MG tablet Take 81 mg by mouth daily.   Yes [provider]  cephALEXin (KEFLEX) 500 MG capsule Take 500 mg by mouth 2 (two) times daily.   Yes [provider]  Cholecalciferol (VITAMIN D3 PO) Take 1 tablet by mouth daily.   Yes [provider]  enalapril (VASOTEC) 10 MG tablet Take 10 mg by mouth at bedtime.   Yes [provider]  glimepiride (AMARYL) 2 MG tablet Take 2 mg by mouth 2 (two) times daily.   Yes [provider]  meclizine (ANTIVERT) 25 MG tablet Take 25 mg by mouth 3 (three) times daily as needed for dizziness.   Yes [provider]  Multiple Vitamin (MULTIVITAMIN WITH MINERALS) TABS tablet Take 1 tablet by mouth daily.   Yes [provider]  simvastatin (ZOCOR) 40 MG tablet Take 40 mg by mouth daily.   Yes [provider]  sitaGLIPtin (JANUVIA) 25 MG tablet Take 25 mg by mouth daily.   Yes [provider]    Scheduled Meds: . aspirin EC  81 mg Oral Daily  . enoxaparin (LOVENOX) injection  30 mg Subcutaneous Q24H  . insulin aspart  0-9 Units Subcutaneous TID WC  . simvastatin  40 mg Oral Daily   Continuous Infusions: . ferumoxytol    . lactated ringers with kcl 75 mL/hr at 08/14/19 1156   PRN Meds:.acetaminophen **OR** acetaminophen, HYDROmorphone (DILAUDID) injection, ondansetron **OR** ondansetron (ZOFRAN) IV  Allergies as of 08/13/2019  . (No Known Allergies)    No family history on file.  Social History   Socioeconomic History  . Marital status: Widowed    Spouse name: Not on file  .  Number of children: Not on file  . Years of education: Not on file  . Highest education level: Not on file  Occupational History  . Not on file  Social Needs  . Financial resource strain: Not on file  . Food insecurity    Worry: Not on file    Inability: Not on file  . Transportation needs    Medical: Not on file    Non-medical: Not on file  Tobacco Use  . Smoking status: Not on file  Substance and Sexual Activity  . Alcohol use: Not on file   . Drug use: Not on file  . Sexual activity: Not on file  Lifestyle  . Physical activity    Days per week: Not on file    Minutes per session: Not on file  . Stress: Not on file  Relationships  . Social Herbalist on phone: Not on file    Gets together: Not on file    Attends religious service: Not on file    Active member of club or organization: Not on file    Attends meetings of clubs or organizations: Not on file    Relationship status: Not on file  . Intimate partner violence    Fear of current or ex partner: Not on file    Emotionally abused: Not on file    Physically abused: Not on file    Forced sexual activity: Not on file  Other Topics Concern  . Not on file  Social History Narrative  . Not on file    Review of Systems: All negative except as stated above in HPI.  Physical Exam: Vital signs: Vitals:   08/14/19 0821 08/14/19 1207  BP: 104/61 (!) 115/46  Pulse: 89 (!) 109  Resp: 20 (!) 21  Temp:  99 F (37.2 C)  SpO2: 98% 98%   Last BM Date: 08/11/19 Physical Exam  Constitutional: She is oriented to person, place, and time. She appears well-developed and well-nourished. No distress.  HENT:  Head: Normocephalic and atraumatic.  Eyes: EOM are normal. No scleral icterus.  Neck: Normal range of motion. Neck supple.  Cardiovascular: Normal rate, regular rhythm and normal heart sounds.  Pulmonary/Chest: Effort normal and breath sounds normal. No respiratory distress.  Abdominal: Bowel sounds are normal. She exhibits distension. There is abdominal tenderness. There is no rebound and no guarding.  Abdomen is distended with generalized discomfort on palpation  Musculoskeletal: Normal range of motion.        General: No edema.  Neurological: She is alert and oriented to person, place, and time. No cranial nerve deficit.  Skin: Skin is warm. No erythema.  Psychiatric: She has a normal mood and affect. Judgment and thought content normal.  Vitals  reviewed.   GI:  Lab Results: Recent Labs    08/13/19 1104 08/14/19 0107  WBC 12.2* 6.9  HGB 9.3* 8.3*  HCT 32.0* 27.7*  PLT 295 249   BMET Recent Labs    08/13/19 1104 08/14/19 0107  NA 135 136  K 4.2 4.3  CL 98 98  CO2 22 28  GLUCOSE 324* 275*  BUN 38* 50*  CREATININE 2.24* 2.57*  CALCIUM 8.6* 7.9*   LFT Recent Labs    08/13/19 1104  PROT 6.3*  ALBUMIN 3.1*  AST 19  ALT 20  ALKPHOS 47  BILITOT 0.8   PT/INR No results for input(s): LABPROT, INR in the last 72 hours.   Studies/Results: Ct Abdomen Pelvis Wo Contrast  Result Date: 08/13/2019 CLINICAL DATA:  Flank pain. Right lower quadrant abdominal pain. EXAM: CT ABDOMEN AND PELVIS WITHOUT CONTRAST TECHNIQUE: Multidetector CT imaging of the abdomen and pelvis was performed following the standard protocol without IV contrast. COMPARISON:  None. FINDINGS: Lower chest: There is atelectasis versus scarring at the lung bases.The heart size is normal. Hepatobiliary: The liver is normal. Normal gallbladder.There is no biliary ductal dilation. Pancreas: Normal contours without ductal dilatation. No peripancreatic fluid collection. Spleen: No splenic laceration or hematoma. Adrenals/Urinary Tract: --Adrenal glands: No adrenal hemorrhage. --Right kidney/ureter: No hydronephrosis or perinephric hematoma. --Left kidney/ureter: There is a complex 4.9 x 3.8 cm mass arising from the upper pole the right kidney. This measures approximately 29 Hounsfield units. There is an additional smaller exophytic lesion arising from the lower pole measuring approximately 2.2 x 1.8 cm. This lesion contains a small calcification. There are no radiopaque obstructing kidney stones. No hydronephrosis. --Urinary bladder: The urinary bladder is decompressed and therefore poorly evaluated. Stomach/Bowel: --Stomach/Duodenum: No hiatal hernia or other gastric abnormality. Normal duodenal course and caliber. --Small bowel: There are dilated loops of small bowel  primarily involving the ileum and distal jejunum. The small bowel loops measure up to approximately 2.8 cm in diameter. Air-fluid levels are noted. --Colon: There is sigmoid diverticulosis without CT evidence for diverticulitis. The transverse colon, descending colon, sigmoid colon, and rectum are relatively decompressed. The cecum and proximal ascending colon are significantly dilated measuring up to approximately 11 cm in diameter. There is an abrupt transition point involving the distal descending colon. --Appendix: Not visualized. No right lower quadrant inflammation or free fluid. Vascular/Lymphatic: Atherosclerotic calcification is present within the non-aneurysmal abdominal aorta, without hemodynamically significant stenosis. --No retroperitoneal lymphadenopathy. --there are few mildly prominent mesenteric lymph nodes in the right lower quadrant. --No pelvic or inguinal lymphadenopathy. Reproductive: Status post hysterectomy. No adnexal mass. Other: There is no free air. There is a trace amount of free fluid in the right pericolic gutter. The abdominal wall is normal. Musculoskeletal. No acute displaced fractures. IMPRESSION: 1. Moderate grade obstruction at the level of the ascending colon with a distinct abrupt transition point as detailed above. This further results in dilatation of the distal small bowel. An exact cause of this abrupt transition point and obstruction is not identified. An underlying mass is not excluded. Follow-up is recommended. 2. There is a complex 4.9 x 3.8 cm mass arising from the upper pole of the right kidney. This could represent a hemorrhagic/proteinaceous cyst versus a solid mass. Further evaluation with nonemergent MRI with and without contrast is recommended. 3. Sigmoid diverticulosis without CT evidence for diverticulitis. 4. Trace amount of free fluid in the right pericolic gutter. No free air. 5. No hydronephrosis.  No radiopaque kidney stones. Aortic Atherosclerosis  (ICD10-I70.0). Electronically Signed   By: Constance Holster M.D.   On: 08/13/2019 19:00    Impression/Plan: -Colonic obstruction with transition point at distal ascending colon near hepatic flexure.  CT scan imaging reviewed personally.  Patient having intermittent symptoms for last 5 weeks according to daughter. -Anemia.  No overt bleeding. -Chronic kidney disease  Recommendations ------------------------ -Intermittent abdominal pain for last 5 weeks.  She has anemia.  Possibility of underlying colon cancer discussed with the patient and patient's family.  Detailed anatomy discussed.  Colonic stent also discussed.  Complications such as perforation, bleeding, infection, stent migration also discussed.  -Her abdomen is distended.  Recommend NG suction.  Keep n.p.o. for now.  Repeat x-ray in the morning.  -I will  discuss with Dr. Watt Climes for colonic stent placement in next few days.    LOS: 1 day   Otis Brace  MD, FACP 08/14/2019, 1:03 PM  Contact #  [phone]

## 2019-08-15 ENCOUNTER — Inpatient Hospital Stay (HOSPITAL_COMMUNITY): Payer: Medicare Other

## 2019-08-15 LAB — GLUCOSE, CAPILLARY
Glucose-Capillary: 75 mg/dL (ref 70–99)
Glucose-Capillary: 82 mg/dL (ref 70–99)
Glucose-Capillary: 91 mg/dL (ref 70–99)
Glucose-Capillary: 84 mg/dL (ref 70–99)
Glucose-Capillary: 87 mg/dL (ref 70–99)

## 2019-08-15 LAB — BASIC METABOLIC PANEL
Anion gap: 11 (ref 5–15)
BUN: 53 mg/dL — ABNORMAL HIGH (ref 8–23)
CO2: 23 mmol/L (ref 22–32)
Calcium: 7.6 mg/dL — ABNORMAL LOW (ref 8.9–10.3)
Chloride: 101 mmol/L (ref 98–111)
Creatinine, Ser: 2.47 mg/dL — ABNORMAL HIGH (ref 0.44–1.00)
GFR calc Af Amer: 20 mL/min — ABNORMAL LOW (ref >60)
GFR calc non Af Amer: 17 mL/min — ABNORMAL LOW (ref >60)
Glucose, Bld: 88 mg/dL (ref 70–99)
Potassium: 4.2 mmol/L (ref 3.5–5.1)
Sodium: 135 mmol/L (ref 135–145)

## 2019-08-15 LAB — CEA: CEA: 7.1 ng/mL — ABNORMAL HIGH (ref 0.0–4.7)

## 2019-08-15 LAB — CBC
HCT: 25.5 % — ABNORMAL LOW (ref 36.0–46.0)
Hemoglobin: 7.7 g/dL — ABNORMAL LOW (ref 12.0–15.0)
MCH: 24 pg — ABNORMAL LOW (ref 26.0–34.0)
MCHC: 30.2 g/dL (ref 30.0–36.0)
MCV: 79.4 fL — ABNORMAL LOW (ref 80.0–100.0)
Platelets: 230 10*3/uL (ref 150–400)
RBC: 3.21 MIL/uL — ABNORMAL LOW (ref 3.87–5.11)
RDW: 17.6 % — ABNORMAL HIGH (ref 11.5–15.5)
WBC: 4 10*3/uL (ref 4.0–10.5)
nRBC: 0 % (ref 0.0–0.2)

## 2019-08-15 MED ORDER — FLEET ENEMA 7-19 GM/118ML RE ENEM
1.0000 | ENEMA | Freq: Once | RECTAL | Status: AC
  Administered 2019-08-16: 06:00:00 1 via RECTAL
  Filled 2019-08-15: qty 1

## 2019-08-15 MED ORDER — ENOXAPARIN SODIUM 30 MG/0.3ML ~~LOC~~ SOLN
30.0000 mg | SUBCUTANEOUS | Status: DC
  Administered 2019-08-17 – 2019-08-18 (×2): 30 mg via SUBCUTANEOUS
  Filled 2019-08-15 (×2): qty 0.3

## 2019-08-15 MED ORDER — FLEET ENEMA 7-19 GM/118ML RE ENEM
1.0000 | ENEMA | Freq: Once | RECTAL | Status: AC
  Administered 2019-08-16: 1 via RECTAL
  Filled 2019-08-15: qty 1

## 2019-08-15 MED ORDER — HYDROMORPHONE HCL 1 MG/ML IJ SOLN
0.5000 mg | INTRAMUSCULAR | Status: DC | PRN
  Administered 2019-08-15 – 2019-08-16 (×4): 0.5 mg via INTRAVENOUS
  Filled 2019-08-15 (×4): qty 1

## 2019-08-15 MED ORDER — SODIUM CHLORIDE 0.9 % IV SOLN
INTRAVENOUS | Status: DC
  Administered 2019-08-15 – 2019-08-16 (×2): via INTRAVENOUS

## 2019-08-15 MED ORDER — SODIUM CHLORIDE 0.9 % IV SOLN: INTRAVENOUS | Status: DC

## 2019-08-15 MED ORDER — FLEET ENEMA 7-19 GM/118ML RE ENEM
1.0000 | ENEMA | Freq: Once | RECTAL | Status: AC
  Administered 2019-08-15: 20:00:00 1 via RECTAL
  Filled 2019-08-15: qty 1

## 2019-08-15 NOTE — Plan of Care (Signed)

## 2019-08-15 NOTE — Progress Notes (Addendum)
[facility] Gastroenterology Progress Note  Mallory Mcpherson 85 y.o. November 01, 1929  CC: Colonic obstruction   Subjective: Continues to have abdominal pain.  NG suction in place.  ROS : Negative for chest pain and shortness of breath   Objective: Vital signs in last 24 hours: Vitals:   08/14/19 2310 08/15/19 0336  BP: (!) 112/57 138/73  Pulse: 85 88  Resp: 20 16  Temp: 98.5 F (36.9 C) 98.9 F (37.2 C)  SpO2: 96% 98%    Physical Exam:  General.  Elderly patient resting comfortably in the bed.  NG suction in place Abdomen.  Right-sided tenderness to palpation, bowel sounds present, abdomen is distended, no peritoneal signs Neuro.  Alert/oriented x3 Psych.  Mood and affect normal  Lab Results: Recent Labs    08/14/19 0107 08/15/19 0307  NA 136 135  K 4.3 4.2  CL 98 101  CO2 28 23  GLUCOSE 275* 88  BUN 50* 53*  CREATININE 2.57* 2.47*  CALCIUM 7.9* 7.6*   Recent Labs    08/13/19 1104  AST 19  ALT 20  ALKPHOS 47  BILITOT 0.8  PROT 6.3*  ALBUMIN 3.1*   Recent Labs    08/14/19 0107 08/15/19 0307  WBC 6.9 4.0  HGB 8.3* 7.7*  HCT 27.7* 25.5*  MCV 79.1* 79.4*  PLT 249 230   No results for input(s): LABPROT, INR in the last 72 hours.    Assessment/Plan: -Colonic obstruction with transition point at distal ascending colon near hepatic flexure.  CT scan imaging reviewed personally.  Patient having intermittent symptoms for last 5 weeks according to daughter. -Anemia.  No overt bleeding. -Chronic kidney disease  Recommendations ------------------------ -Case discussed with Dr. Watt Climes.   Plan for colonoscopy with possible stent placement tomorrow around 1 PM. -1 Fleet enema today evening.  Two  enemas tomorrow morning  continue NG suction.  Keep her n.p.o. -Repeat abdominal x-ray in the morning.  GI will follow  Risks (bleeding, infection, bowel perforation that could require surgery, sedation-related changes in cardiopulmonary systems), benefits (identification  and possible treatment of source of symptoms, exclusion of certain causes of symptoms), and alternatives (watchful waiting, radiographic imaging studies, empiric medical treatment)  were explained to patient/family in detail and patient wishes to proceed.   Otis Brace MD, [place] 08/15/2019, 11:30 AM  Contact #  [phone]

## 2019-08-15 NOTE — Progress Notes (Signed)
   Subjective: Pt seen at the bedside on rounds this AM.  The patient says her abdominal pain has increased since yesterday.  The Dilaudid is not helping like it was yesterday.  I explained to her that opioids can make her obstruction worse so it will be a tough balanced between pain control and worsening of the bowel obstruction.  Patient reports that she understands. She denies feeling nauseous this morning and says she can take sips of water.  NG tube output 300 cc of dark green fluid.   Objective:  Vital signs in last 24 hours: Vitals:   08/14/19 1836 08/14/19 2034 08/14/19 2310 08/15/19 0336  BP: 126/64 (!) 113/59 (!) 112/57 138/73  Pulse: (!) 107 90 85 88  Resp:  19 20 16   Temp: 98.6 F (37 C) 98.4 F (36.9 C) 98.5 F (36.9 C) 98.9 F (37.2 C)  TempSrc: Oral Oral Oral Oral  SpO2: 98% 95% 96% 98%  Weight:      Height:       Xray Abdomen: IMPRESSION: 1. Persistent dilated small bowel consistent with a partial bowel obstruction. CT scan from 08/13/2019 suggested mechanical obstruction at the level of the ascending colon.  Physical Exam: [place]: Ill-appearing female resting in bed HEENT: NCAT, NG tube in place CV: RRR, normal S1 and S2 no murmurs rubs or gallops appreciated, trace left-sided lower extremity edema (chronic per patient) PULM: Clear to auscultation bilaterally, no crackles or wheezes appreciated ABD: Distended, diffusely tender to light palpation NEURO: Alert and oriented, no focal deficits appreciated  Assessment/Plan:  Principal Problem:   Bowel obstruction (HCC) Active Problems:   Diabetes ([place])   Renal insufficiency   Leukocytosis   Anemia   Renal mass, right  In summary, Mallory Mcpherson is a 85 year old female with a history notable for CKD, T2DM, HTN and vertigo who presented with abdominal pain distention.  Imaging was notable for moderate grade obstruction of the ascending colon with a distinct transition point.  This is likely related to her prior  abdominal surgical history and R kidney mass spacious for malignancy.  The patient has not interested in surgical intervention at this time.  GI has been consulted for potential stent placement.   #Abdominal pain  #Large Bowel Obstruction: GI and general surgery are following the patient and we appreciate their recommendations.  The patient was adamant that she did not want to try surgery at this time.The plan is to attempt stent placement to relieve the obstruction. If this is unsuccessful then she is open to consider surgical options. - NG tube in place, continue n.p.o. status - Zofran 4 mg q6hrs PRN for nausea - Dilaudid 0.5 mg q3hrs PRN for abdominal pain   #Anemia: Iron studies significant for low iron (9). S/p 1 dose of ferriheme on 9/20. CBC demonstrated low Hgb today of 7.7, which has been down trending from 9.3 on admission. Concerning for potential bleeding, will continue to monitor.  - Daily CBCs  #HTN: Enalapril 10 mg daily at home. - Holding Enalapril in the setting of acute illness  #T2DM: A1c 9.3 on admission. Pt takes sitagliptin 25 mg daily, glimepiride 2mg  daily at home.  -SSI  #FEN/GI - Diet: NPO - IVF: LR 75 cc/hr   #DVT Prophylaxis - Lovenox 30 mg subq injections daily.  If patient shows signs of bleeding will discontinue.  Dispo: Pending medical course.   Mallory Plater, MD Internal Medicine, PGY1 Pager: [phone]  08/15/2019,11:49 AM

## 2019-08-15 NOTE — Progress Notes (Addendum)
[place] Office:  [phone] General Surgery Progress Note   LOS: 2 days  POD -     Chief Complaint: Abdominal pain  Assessment and Plan: 1.  Ascending colon stricture (?mass) with significant dilation of the right colon - question benign or malignant stricture - CEA 7.1 - suggestive of underlying malignancy  Keep NPO.  Seen by Dr. Rosalie Gums who will talk to Dr. Watt Climes about possible stent.  Await the results of possible stent to see what the next step is.  She has no IVF at this time (?).  Abdominal pain about the same as yesterday  2. DM  HgbA1C - 9.3 - 08/14/2019 3.  HTN 4.  Right renal mass 5.  ARF  Creatinine - 2.47 - 08/15/2019 6. Anemia  Hgb - 7.7 - 08/15/2019 7.  DVT prophylaxis - Lovenox   Principal Problem:   Bowel obstruction (HCC) Active Problems:   Diabetes ([place])   Renal insufficiency   Leukocytosis   Anemia   Renal mass, right   Subjective:  Her right sided abdominal pain is about the same.  She lives in [place].  I spoke to her sister, Eugenia Mcalpine, yesterday and reviewed the plans.  Objective:   Vitals:   08/14/19 2310 08/15/19 0336  BP: (!) 112/57 138/73  Pulse: 85 88  Resp: 20 16  Temp: 98.5 F (36.9 C) 98.9 F (37.2 C)  SpO2: 96% 98%     Intake/Output from previous day:  09/19 0701 - 09/20 0700 In: 927.5 [P.O.:200; I.V.:727.5] Out: 150 [Emesis/NG output:150]  Intake/Output this shift:  No intake/output data recorded.   Physical Exam:   General: Obese WF who is alert and oriented.   Has NGT   HEENT: Normal. Pupils equal.   Heart:  Has a 2/6 systolic murmur .   Lungs: Clear   Abdomen: Tender right abdomen.   Lab Results:    Recent Labs    08/14/19 0107 08/15/19 0307  WBC 6.9 4.0  HGB 8.3* 7.7*  HCT 27.7* 25.5*  PLT 249 230    BMET   Recent Labs    08/14/19 0107 08/15/19 0307  NA 136 135  K 4.3 4.2  CL 98 101  CO2 28 23  GLUCOSE 275* 88  BUN 50* 53*  CREATININE 2.57* 2.47*  CALCIUM 7.9* 7.6*     PT/INR  No results for input(s): LABPROT, INR in the last 72 hours.  ABG  No results for input(s): PHART, HCO3 in the last 72 hours.  Invalid input(s): PCO2, PO2   Studies/Results:  Ct Abdomen Pelvis Wo Contrast  Result Date: 08/13/2019 CLINICAL DATA:  Flank pain. Right lower quadrant abdominal pain. EXAM: CT ABDOMEN AND PELVIS WITHOUT CONTRAST TECHNIQUE: Multidetector CT imaging of the abdomen and pelvis was performed following the standard protocol without IV contrast. COMPARISON:  None. FINDINGS: Lower chest: There is atelectasis versus scarring at the lung bases.The heart size is normal. Hepatobiliary: The liver is normal. Normal gallbladder.There is no biliary ductal dilation. Pancreas: Normal contours without ductal dilatation. No peripancreatic fluid collection. Spleen: No splenic laceration or hematoma. Adrenals/Urinary Tract: --Adrenal glands: No adrenal hemorrhage. --Right kidney/ureter: No hydronephrosis or perinephric hematoma. --Left kidney/ureter: There is a complex 4.9 x 3.8 cm mass arising from the upper pole the right kidney. This measures approximately 29 Hounsfield units. There is an additional smaller exophytic lesion arising from the lower pole measuring approximately 2.2 x 1.8 cm. This lesion contains a small calcification. There are no radiopaque obstructing kidney stones. No  hydronephrosis. --Urinary bladder: The urinary bladder is decompressed and therefore poorly evaluated. Stomach/Bowel: --Stomach/Duodenum: No hiatal hernia or other gastric abnormality. Normal duodenal course and caliber. --Small bowel: There are dilated loops of small bowel primarily involving the ileum and distal jejunum. The small bowel loops measure up to approximately 2.8 cm in diameter. Air-fluid levels are noted. --Colon: There is sigmoid diverticulosis without CT evidence for diverticulitis. The transverse colon, descending colon, sigmoid colon, and rectum are relatively decompressed. The cecum and  proximal ascending colon are significantly dilated measuring up to approximately 11 cm in diameter. There is an abrupt transition point involving the distal descending colon. --Appendix: Not visualized. No right lower quadrant inflammation or free fluid. Vascular/Lymphatic: Atherosclerotic calcification is present within the non-aneurysmal abdominal aorta, without hemodynamically significant stenosis. --No retroperitoneal lymphadenopathy. --there are few mildly prominent mesenteric lymph nodes in the right lower quadrant. --No pelvic or inguinal lymphadenopathy. Reproductive: Status post hysterectomy. No adnexal mass. Other: There is no free air. There is a trace amount of free fluid in the right pericolic gutter. The abdominal wall is normal. Musculoskeletal. No acute displaced fractures. IMPRESSION: 1. Moderate grade obstruction at the level of the ascending colon with a distinct abrupt transition point as detailed above. This further results in dilatation of the distal small bowel. An exact cause of this abrupt transition point and obstruction is not identified. An underlying mass is not excluded. Follow-up is recommended. 2. There is a complex 4.9 x 3.8 cm mass arising from the upper pole of the right kidney. This could represent a hemorrhagic/proteinaceous cyst versus a solid mass. Further evaluation with nonemergent MRI with and without contrast is recommended. 3. Sigmoid diverticulosis without CT evidence for diverticulitis. 4. Trace amount of free fluid in the right pericolic gutter. No free air. 5. No hydronephrosis.  No radiopaque kidney stones. Aortic Atherosclerosis (ICD10-I70.0). Electronically Signed   By: Constance Holster M.D.   On: 08/13/2019 19:00   Dg Abd 1 View  Result Date: 08/14/2019 CLINICAL DATA:  Abdominal distension, NG tube placement EXAM: ABDOMEN - 1 VIEW COMPARISON:  CT 08/13/2019 FINDINGS: Esophageal tube tip and side port overlie the proximal stomach. Multiple dilated loops of  small bowel measuring up to 3.1 cm in the left hemiabdomen with paucity of distal bowel gas, consistent with a bowel obstruction IMPRESSION: 1. Esophageal tube tip overlies the proximal stomach 2. Multiple dilated loops of small bowel in the left hemiabdomen compatible with a bowel obstruction Electronically Signed   By: Donavan Foil M.D.   On: 08/14/2019 19:29   Dg Abd Portable 1v  Result Date: 08/15/2019 CLINICAL DATA:  Abdominal distension and epigastric pain. EXAM: PORTABLE ABDOMEN - 1 VIEW COMPARISON:  08/14/2019 and CT dated 08/13/2019. FINDINGS: Multiple loops of mildly dilated small bowel are again noted, unchanged from the previous day's study. Nasogastric tube is stable, tip in the mid stomach. IMPRESSION: 1. Persistent dilated small bowel consistent with a partial bowel obstruction. CT scan from 08/13/2019 suggested mechanical obstruction at the level of the ascending colon. Electronically Signed   By: Lajean Manes M.D.   On: 08/15/2019 07:56     Anti-infectives:   Anti-infectives (From admission, onward)   Start     Dose/Rate Route Frequency Ordered Stop   08/13/19 2200  cephALEXin (KEFLEX) capsule 250 mg  Status:  Discontinued     250 mg Oral 2 times daily 08/13/19 2119 08/14/19 1302   08/13/19 1545  piperacillin-tazobactam (ZOSYN) IVPB 4.5 g  Status:  Discontinued  4.5 g 200 mL/hr over 30 Minutes Intravenous  Once 08/13/19 1541 08/13/19 1543   08/13/19 1545  piperacillin-tazobactam (ZOSYN) IVPB 4.5 g     4.5 g 200 mL/hr over 30 Minutes Intravenous  Once 08/13/19 1544 08/13/19 1718   08/13/19 1545  piperacillin-tazobactam (ZOSYN) IVPB 3.375 g  Status:  Discontinued     3.375 g 100 mL/hr over 30 Minutes Intravenous  Once 08/13/19 1544 08/13/19 1938      Alphonsa Overall, MD, FACS Pager: [place] [facility] Office: [phone] 08/15/2019

## 2019-08-16 ENCOUNTER — Inpatient Hospital Stay (HOSPITAL_COMMUNITY): Payer: Medicare Other

## 2019-08-16 ENCOUNTER — Inpatient Hospital Stay (HOSPITAL_COMMUNITY): Payer: Medicare Other | Admitting: Anesthesiology

## 2019-08-16 ENCOUNTER — Encounter (HOSPITAL_COMMUNITY): Payer: Self-pay

## 2019-08-16 ENCOUNTER — Encounter (HOSPITAL_COMMUNITY)
Admission: EM | Disposition: A | Discharge status: Home Health | Payer: Self-pay | Source: Home / Self Care | Attending: Internal Medicine

## 2019-08-16 DIAGNOSIS — Z79899 Other long term (current) drug therapy: Secondary | ICD-10-CM

## 2019-08-16 HISTORY — PX: COLONOSCOPY WITH PROPOFOL: SHX5780

## 2019-08-16 HISTORY — PX: COLONIC STENT PLACEMENT: SHX5542

## 2019-08-16 LAB — CBC WITH DIFFERENTIAL/PLATELET
Abs Immature Granulocytes: 0.02 10*3/uL (ref 0.00–0.07)
Basophils Absolute: 0 10*3/uL (ref 0.0–0.1)
Basophils Relative: 0 %
Eosinophils Absolute: 0.1 10*3/uL (ref 0.0–0.5)
Eosinophils Relative: 2 %
HCT: 28.7 % — ABNORMAL LOW (ref 36.0–46.0)
Hemoglobin: 8.3 g/dL — ABNORMAL LOW (ref 12.0–15.0)
Immature Granulocytes: 1 %
Lymphocytes Relative: 21 %
Lymphs Abs: 0.9 10*3/uL (ref 0.7–4.0)
MCH: 23.6 pg — ABNORMAL LOW (ref 26.0–34.0)
MCHC: 28.9 g/dL — ABNORMAL LOW (ref 30.0–36.0)
MCV: 81.5 fL (ref 80.0–100.0)
Monocytes Absolute: 0.8 10*3/uL (ref 0.1–1.0)
Monocytes Relative: 19 %
Neutro Abs: 2.6 10*3/uL (ref 1.7–7.7)
Neutrophils Relative %: 57 %
Platelets: 288 10*3/uL (ref 150–400)
RBC: 3.52 MIL/uL — ABNORMAL LOW (ref 3.87–5.11)
RDW: 17.5 % — ABNORMAL HIGH (ref 11.5–15.5)
WBC: 4.4 10*3/uL (ref 4.0–10.5)
nRBC: 0 % (ref 0.0–0.2)

## 2019-08-16 LAB — BASIC METABOLIC PANEL
Anion gap: 8 (ref 5–15)
BUN: 50 mg/dL — ABNORMAL HIGH (ref 8–23)
CO2: 25 mmol/L (ref 22–32)
Calcium: 7.6 mg/dL — ABNORMAL LOW (ref 8.9–10.3)
Chloride: 106 mmol/L (ref 98–111)
Creatinine, Ser: 1.94 mg/dL — ABNORMAL HIGH (ref 0.44–1.00)
GFR calc Af Amer: 26 mL/min — ABNORMAL LOW (ref >60)
GFR calc non Af Amer: 23 mL/min — ABNORMAL LOW (ref >60)
Glucose, Bld: 70 mg/dL (ref 70–99)
Potassium: 4.1 mmol/L (ref 3.5–5.1)
Sodium: 139 mmol/L (ref 135–145)

## 2019-08-16 LAB — GLUCOSE, CAPILLARY
Glucose-Capillary: 58 mg/dL — ABNORMAL LOW (ref 70–99)
Glucose-Capillary: 128 mg/dL — ABNORMAL HIGH (ref 70–99)
Glucose-Capillary: 100 mg/dL — ABNORMAL HIGH (ref 70–99)
Glucose-Capillary: 104 mg/dL — ABNORMAL HIGH (ref 70–99)
Glucose-Capillary: 73 mg/dL (ref 70–99)

## 2019-08-16 LAB — PREALBUMIN: Prealbumin: 16.6 mg/dL — ABNORMAL LOW (ref 18–38)

## 2019-08-16 SURGERY — COLONOSCOPY WITH PROPOFOL
Anesthesia: Monitor Anesthesia Care

## 2019-08-16 MED ORDER — PROPOFOL 500 MG/50ML IV EMUL
INTRAVENOUS | Status: DC | PRN
  Administered 2019-08-16: 75 ug/kg/min via INTRAVENOUS

## 2019-08-16 MED ORDER — POLYETHYLENE GLYCOL 3350 17 G PO PACK
17.0000 g | PACK | Freq: Two times a day (BID) | ORAL | Status: DC
  Administered 2019-08-17 – 2019-08-18 (×3): 17 g via ORAL
  Filled 2019-08-16 (×3): qty 1

## 2019-08-16 MED ORDER — SODIUM CHLORIDE 0.9 % IV SOLN
INTRAVENOUS | Status: DC | PRN
  Administered 2019-08-16: 14:00:00 30 mL

## 2019-08-16 MED ORDER — LACTATED RINGERS IV SOLN
INTRAVENOUS | Status: DC
  Administered 2019-08-16: 13:00:00 via INTRAVENOUS

## 2019-08-16 MED ORDER — PANTOPRAZOLE SODIUM 40 MG IV SOLR
40.0000 mg | INTRAVENOUS | Status: DC
  Administered 2019-08-16 – 2019-08-18 (×3): 40 mg via INTRAVENOUS
  Filled 2019-08-16 (×3): qty 40

## 2019-08-16 MED ORDER — DEXTROSE 50 % IV SOLN
12.5000 g | Freq: Once | INTRAVENOUS | Status: AC
  Administered 2019-08-16: 12.5 g via INTRAVENOUS

## 2019-08-16 MED ORDER — HYDROMORPHONE HCL 1 MG/ML IJ SOLN
0.5000 mg | INTRAMUSCULAR | Status: DC | PRN
  Administered 2019-08-16 – 2019-08-17 (×3): 0.5 mg via INTRAVENOUS
  Filled 2019-08-16 (×3): qty 1

## 2019-08-16 MED ORDER — PHENYLEPHRINE 40 MCG/ML (10ML) SYRINGE FOR IV PUSH (FOR BLOOD PRESSURE SUPPORT)
PREFILLED_SYRINGE | INTRAVENOUS | Status: DC | PRN
  Administered 2019-08-16: 120 ug via INTRAVENOUS

## 2019-08-16 MED ORDER — PHENOL 1.4 % MT LIQD: 1.0000 | OROMUCOSAL | Status: DC | PRN

## 2019-08-16 SURGICAL SUPPLY — 22 items

## 2019-08-16 NOTE — Care Management Important Message (Signed)
Important Message  Patient Details  Name: Mallory Mcpherson MRN: [id number] Date of Birth: March 09, 1929   Medicare Important Message Given:  Yes     Shelda Altes 08/16/2019, 12:20 PM

## 2019-08-16 NOTE — Progress Notes (Signed)
Hypoglycemic Event  CBG: 58  Treatment: D50 25 mL (12.5 gm)  Symptoms: None  Follow-up CBG: [id number] CBG Result:128  Possible Reasons for Event: Inadequate meal intake  Comments/MD notified: Pt asymptomatic. Hasn't not eating in about three days and is NPO at this time. D50 given CBG now 128 will continue to monitor.     Mallory Mcpherson

## 2019-08-16 NOTE — Anesthesia Preprocedure Evaluation (Addendum)
Anesthesia Evaluation  Patient identified by MRN, date of birth, ID band Patient awake    Reviewed: Allergy & Precautions, NPO status , Patient's Chart, lab work & pertinent test results  History of Anesthesia Complications Negative for: history of anesthetic complications  Airway Mallampati: II  TM Distance: >3 FB Neck ROM: Full    Dental  (+) Edentulous Upper, Edentulous Lower   Pulmonary neg pulmonary ROS,    Pulmonary exam normal        Cardiovascular hypertension, Pt. on medications Normal cardiovascular exam     Neuro/Psych negative neurological ROS  negative psych ROS   GI/Hepatic Neg liver ROS, Large bowel obstruction   Endo/Other  diabetes, Type 2, Oral Hypoglycemic Agents  Renal/GU Renal InsufficiencyRenal disease (Cr 1.94)  negative genitourinary   Musculoskeletal negative musculoskeletal ROS (+)   Abdominal   Peds  Hematology  (+) anemia , Hgb 8.3   Anesthesia Other Findings Day of surgery medications reviewed with patient.  Reproductive/Obstetrics negative OB ROS                            Anesthesia Physical Anesthesia Plan  ASA: III  Anesthesia Plan: MAC   Post-op Pain Management:    Induction:   PONV Risk Score and Plan: 2 and Treatment may vary due to age or medical condition and Propofol infusion  Airway Management Planned: Natural Airway and Simple Face Mask  Additional Equipment: None  Intra-op Plan:   Post-operative Plan:   Informed Consent: I have reviewed the patients History and Physical, chart, labs and discussed the procedure including the risks, benefits and alternatives for the proposed anesthesia with the patient or authorized representative who has indicated his/her understanding and acceptance.     Dental advisory given  Plan Discussed with: CRNA  Anesthesia Plan Comments:        Anesthesia Quick Evaluation

## 2019-08-16 NOTE — Progress Notes (Signed)
Internal Medicine Attending:   I saw and examined the patient. I reviewed the resident's note and I agree with the resident's findings and plan as documented in the resident's note.  Patient states that her abdominal pain is tension is mildly improved but still present.  NG tube is in place.  Patient initially admitted to the hospital with large bowel obstruction.  GI follow-up and recommendations appreciated.  Patient is status post colonoscopy today which showed a partially obstructing mass at the hepatic flexure likely malignant.  Colonic stent was placed during this procedure.  We will start clear liquids today per GI recommendations and clamp NG tube.  Patient will need MiraLAX twice daily to prevent the stent from clogging.  Continue with Dilaudid for abdominal pain.  Surgery follow-up and recommendations appreciated.  Continue with IV fluids for now.  We will continue to monitor closely.  I suspect patient will need further intervention if she desires it given likely malignancy at the hepatic flexure.  Will discuss this with patient tomorrow.

## 2019-08-16 NOTE — Progress Notes (Signed)
Mallory Mcpherson 12:54 PM  Subjective: Patient is actually doing better after her enemas and with her NG tube in and having less pain in her hospital computer chart was reviewed and her case discussed with my partner Dr. Alessandra Bevels and we rediscussed the procedure Objective: Vital signs stable afebrile no acute distress exam please see preassessment evaluation abdomen specifically is slightly distended rare bowel sounds surprisingly not tender today labs and CT reviewed  Assessment: Colonic obstruction  Plan: The procedure was rediscussed including the complications and possibilities that we cannot get the stent in and happy to proceed today with anesthesia assistance with further work-up and plans pending those findings  [facility]  office [phone] After 5PM or if no answer call [phone]

## 2019-08-16 NOTE — Transfer of Care (Signed)
Immediate Anesthesia Transfer of Care Note  Patient: Mallory Mcpherson  Procedure(s) Performed: COLONOSCOPY WITH PROPOFOL (N/A ) COLONIC STENT PLACEMENT (N/A )  Patient Location: PACU  Anesthesia Type:MAC  Level of Consciousness: awake, alert  and oriented  Airway & Oxygen Therapy: Patient Spontanous Breathing  Post-op Assessment: Report given to RN and Post -op Vital signs reviewed and stable  Post vital signs: Reviewed and stable  Last Vitals:  Vitals Value Taken Time  BP 146/70 08/16/19 1431  Temp 37 C 08/16/19 1422  Pulse 85 08/16/19 1433  Resp 18 08/16/19 1433  SpO2 93 % 08/16/19 1433  Vitals shown include unvalidated device data.  Last Pain:  Vitals:   08/16/19 1422  TempSrc: Oral  PainSc: 3       Patients Stated Pain Goal: 0 (94/32/76 1470)  Complications: No apparent anesthesia complications

## 2019-08-16 NOTE — Progress Notes (Signed)
   Subjective: Pt seen at the bedside on rounds this AM.  The patient says her abdominal pain is a little bit better than it was yesterday but still present.  She is not currently feeling nauseous.  She understands that she will see the GI doctors for stent placement later today in attempt to relieve the bowel obstruction.  NG tube output over 1L of dark green fluid.   Objective:  Vital signs in last 24 hours: Vitals:   08/15/19 1217 08/15/19 1941 08/15/19 2358 08/16/19 0353  BP: (!) 110/57 135/87 (!) 143/79 (!) 118/55  Pulse: 97 84 82 83  Resp: 18 15 14 15   Temp: 99 F (37.2 C) 98.1 F (36.7 C) 98.4 F (36.9 C) 97.7 F (36.5 C)  TempSrc: Oral Oral Oral Oral  SpO2: 98% 96% 98% 99%  Weight:      Height:       Xray Abdomen (9/21): IMPRESSION: Persistent obstructive bowel gas pattern.  Physical Exam: Genral: Appears comfortable, resting in bed HEENT: NCAT, NG tube in place CV: RRR, normal S1 and S2 no murmurs rubs or gallops appreciated PULM: Clear to auscultation in all lung fields bilaterally ABD: Distended and diffusely tender to light palpation NEURO: Alert and oriented, no focal deficits appreciated  Assessment/Plan:  Principal Problem:   Bowel obstruction (HCC) Active Problems:   Diabetes ([place])   Renal insufficiency   Leukocytosis   Anemia   Renal mass, right  In summary, [name] is a 85 year old female with a history notable for CKD, T2DM, HTN and vertigo who presented with abdominal pain distention. Imaging was notable for moderate grade obstruction of the ascending colon with a distinct transition point. This is likely related to her prior abdominal surgical history and R kidney mass spacious for malignancy.  The patient will undergo a procedure with GI today to have a stent placed in attempt to relieve bowel obstruction.  If this does not work, we will reconsult surgery for a surgical intervention.  #Abdominal pain  #Large Bowel Obstruction: GI and general  surgery are following the patient and we appreciate their recommendations.  Scheduled to have colonic stent placed today. If this is unsuccessful then she is open to consider surgical options. - NG tube in place, NPO - Zofran 4 mg q6hrs PRN for nausea - Continue Dilaudid 0.5 mg q3hrs PRN for abdominal pain   #Anemia: Iron studies significant for low iron (9). S/p 1 dose of ferriheme on 9/20. CBC demonstrated low Hgb today of 8.3. Will continue to monitor.  - Daily CBCs  #HTN: Enalapril 10 mg daily at home. - Holding Enalapril in the setting of acute illness  #T2DM: A1c 9.3 on admission. Pt takes sitagliptin 25 mg daily, glimepiride 2mg  daily at home.  -SSI  #FEN/GI - Diet: NPO - IVF: NS 100 cc/hr   #DVT Prophylaxis - Lovenox 30 mg subq injections daily.  If patient shows signs of bleeding will discontinue.  Dispo: Pending medical course.   Earlene Plater, MD Internal Medicine, PGY1 Pager: [phone]  08/16/2019,11:29 AM

## 2019-08-16 NOTE — Progress Notes (Signed)
[facility] Progress Note     Subjective: CC: abdominal pain Patient complaining that she is starting to have some more abdominal pain this AM. Denies nausea. No flatus or BM. Some bloody drainage in NGT. NGT irritates her throat.   Objective: Vital signs in last 24 hours: Temp:  [97.7 F (36.5 C)-99 F (37.2 C)] 97.7 F (36.5 C) (09/21 0353) Pulse Rate:  [82-97] 83 (09/21 0353) Resp:  [14-18] 15 (09/21 0353) BP: (110-143)/(55-87) 118/55 (09/21 0353) SpO2:  [96 %-99 %] 99 % (09/21 0353) Last BM Date: 08/11/19  Intake/Output from previous day: 09/20 0701 - 09/21 0700 In: 80 [P.O.:80] Out: 1150 [Emesis/NG output:1150] Intake/Output this shift: No intake/output data recorded.  PE: Gen:  Alert, NAD, pleasant Card:  Regular rate and rhythm, pedal pulses 2+ BL Pulm:  Normal effort, clear to auscultation bilaterally Abd: generalized ttp, no rebound ttp or guarding, distended, BS very hypoactive Skin: warm and dry, no rashes  Psych: A&Ox3   Lab Results:  Recent Labs    08/15/19 0307 08/16/19 0446  WBC 4.0 4.4  HGB 7.7* 8.3*  HCT 25.5* 28.7*  PLT 230 288   BMET Recent Labs    08/15/19 0307 08/16/19 0446  NA 135 139  K 4.2 4.1  CL 101 106  CO2 23 25  GLUCOSE 88 70  BUN 53* 50*  CREATININE 2.47* 1.94*  CALCIUM 7.6* 7.6*   PT/INR No results for input(s): LABPROT, INR in the last 72 hours. CMP     Component Value Date/Time   NA 139 08/16/2019 0446   K 4.1 08/16/2019 0446   CL 106 08/16/2019 0446   CO2 25 08/16/2019 0446   GLUCOSE 70 08/16/2019 0446   BUN 50 (H) 08/16/2019 0446   CREATININE 1.94 (H) 08/16/2019 0446   CALCIUM 7.6 (L) 08/16/2019 0446   PROT 6.3 (L) 08/13/2019 1104   ALBUMIN 3.1 (L) 08/13/2019 1104   AST 19 08/13/2019 1104   ALT 20 08/13/2019 1104   ALKPHOS 47 08/13/2019 1104   BILITOT 0.8 08/13/2019 1104   GFRNONAA 23 (L) 08/16/2019 0446   GFRAA 26 (L) 08/16/2019 0446   Lipase     Component Value Date/Time   LIPASE 23  08/13/2019 1104       Studies/Results: Dg Abd 1 View  Result Date: 08/16/2019 CLINICAL DATA:  Abdominal pain and distension. EXAM: ABDOMEN - 1 VIEW COMPARISON:  CT scan 08/13/2019 and abdominal x-ray 08/15/2019 FINDINGS: Persistent air distended small bowel loops and cecum consistent with obstruction. No obvious free air. The NG tube is not identified. IMPRESSION: Persistent obstructive bowel gas pattern. Electronically Signed   By: Marijo Sanes M.D.   On: 08/16/2019 08:10   Dg Abd 1 View  Result Date: 08/14/2019 CLINICAL DATA:  Abdominal distension, NG tube placement EXAM: ABDOMEN - 1 VIEW COMPARISON:  CT 08/13/2019 FINDINGS: Esophageal tube tip and side port overlie the proximal stomach. Multiple dilated loops of small bowel measuring up to 3.1 cm in the left hemiabdomen with paucity of distal bowel gas, consistent with a bowel obstruction IMPRESSION: 1. Esophageal tube tip overlies the proximal stomach 2. Multiple dilated loops of small bowel in the left hemiabdomen compatible with a bowel obstruction Electronically Signed   By: Donavan Foil M.D.   On: 08/14/2019 19:29   Dg Abd Portable 1v  Result Date: 08/15/2019 CLINICAL DATA:  Abdominal distension and epigastric pain. EXAM: PORTABLE ABDOMEN - 1 VIEW COMPARISON:  08/14/2019 and CT dated 08/13/2019. FINDINGS: Multiple loops of mildly  dilated small bowel are again noted, unchanged from the previous day's study. Nasogastric tube is stable, tip in the mid stomach. IMPRESSION: 1. Persistent dilated small bowel consistent with a partial bowel obstruction. CT scan from 08/13/2019 suggested mechanical obstruction at the level of the ascending colon. Electronically Signed   By: Lajean Manes M.D.   On: 08/15/2019 07:56    Anti-infectives: Anti-infectives (From admission, onward)   Start     Dose/Rate Route Frequency Ordered Stop   08/13/19 2200  cephALEXin (KEFLEX) capsule 250 mg  Status:  Discontinued     250 mg Oral 2 times daily 08/13/19  2119 08/14/19 1302   08/13/19 1545  piperacillin-tazobactam (ZOSYN) IVPB 4.5 g  Status:  Discontinued     4.5 g 200 mL/hr over 30 Minutes Intravenous  Once 08/13/19 1541 08/13/19 1543   08/13/19 1545  piperacillin-tazobactam (ZOSYN) IVPB 4.5 g     4.5 g 200 mL/hr over 30 Minutes Intravenous  Once 08/13/19 1544 08/13/19 1718   08/13/19 1545  piperacillin-tazobactam (ZOSYN) IVPB 3.375 g  Status:  Discontinued     3.375 g 100 mL/hr over 30 Minutes Intravenous  Once 08/13/19 1544 08/13/19 1938       Assessment/Plan HTN T2DM - SSI R renal mass - non-emergent MRI recommended for better evaluation  ARF - Cr 1.94 this AM, trending down  Anemia - hgb 8.3 this AM  Ascending colon stricture (?mass) with dilated R colon - CEA 7.1, suggestive of underlying malignancy - NPO, NGT on LIWS - GI following, planning colonoscopy with possible stent placement today around 1 PM with Dr. Watt Climes, appreciate assistance - film this AM with persistent obstructive pattern - we will continue to follow closely for surgical planning   FEN: NPO, IVF; NGT to LIWS VTE: SCDs ID: none currently   LOS: 3 days    Brigid Re , [facility] 08/16/2019, 9:22 AM Pager: [phone] Consults: [phone] 7:00 AM - 4:30 PM M, W-F 7:00 AM - 11:30 AM Tues, Sat, Sun

## 2019-08-16 NOTE — Op Note (Signed)
[facility] Patient Name: Mallory Mcpherson Procedure Date : 08/16/2019 MRN: [id number] Attending MD: Clarene Essex , MD Date of Birth: [birth year] CSN: [id number] Age: 85 Admit Type: Inpatient Procedure:                Colonoscopy Indications:              Abnormal CT of the GI tract, For therapy of colonic                            obstruction in patient with distention and                            abdominal pain Providers:                Clarene Essex, MD, Jeanella Cara, RN, Marguerita Merles, Technician, Lazaro Arms, Technician Referring MD:              Medicines:                Propofol total dose 123456 mg IV Complications:            No immediate complications. Estimated Blood Loss:     Estimated blood loss: none. Procedure:                Pre-Anesthesia Assessment:                           - Prior to the procedure, a History and Physical                            was performed, and patient medications and                            allergies were reviewed. The patient's tolerance of                            previous anesthesia was also reviewed. The risks                            and benefits of the procedure and the sedation                            options and risks were discussed with the patient.                            All questions were answered, and informed consent                            was obtained. Prior Anticoagulants: The patient has                            taken no previous anticoagulant or antiplatelet  agents. ASA Grade Assessment: II - A patient with                            mild systemic disease. After reviewing the risks                            and benefits, the patient was deemed in                            satisfactory condition to undergo the procedure.                           After obtaining informed consent, the colonoscope                            was passed  under direct vision. Throughout the                            procedure, the patient's blood pressure, pulse, and                            oxygen saturations were monitored continuously. The                            [id number]) Olympus colonoscope was                            introduced through the anus and advanced to the the                            hepatic flexure. The rectum was photographed. The                            colonoscopy was technically difficult and complex                            due to abnormal anatomy. The patient tolerated the                            procedure well. The quality of the bowel                            preparation was fair. Scope In: 1:26:59 PM Scope Out: 2:00:07 PM Total Procedure Duration: 0 hours 33 minutes 8 seconds  Findings:      Scattered small-mouthed diverticula were found in the sigmoid colon and       distal descending colon.      Two pedunculated and semi-sessile polyps were found in the descending       colon and transverse colon. The polyps were medium in size.       Unfortunately we took pictures of both of these polyps and of       diverticuli and they did not show up on the final report      An ulcerated partially obstructing small mass was found at  the hepatic       flexure. The mass was partially circumferential. In addition, its       diameter measured approximately five mm. No bleeding was present. This       was stented with a 22 mm x 9 cm WallFlex stent. We initially advanced       the ERCP catheter loaded with the long JAG Jagwire and under fluoroscopy       guidance injected contrast to confirm we were still in the colonic lumen       and the stricture was seemingly short but we could not inject enough dye       to get excellent pictures and the wire was advanced and under       fluoroscopy and endoscopic guidance the stent was released and the       introducer and wire were removed and the scope was  slowly withdrawn and       air and fluid and debris was suctioned patient tolerated the procedure       well      The exam was otherwise without abnormality. Impression:               - Preparation of the colon was fair.                           - Diverticulosis in the sigmoid colon and in the                            distal descending colon.                           - Two medium polyps in the descending colon and in                            the transverse colon.                           - Likely malignant partially obstructing tumor at                            the hepatic flexure. Prosthesis placed.                           - The examination was otherwise normal.                           - No specimens collected. Recommendation:           -Sips of clear liquid diet today. Clamp NG today                           - Continue present medications. Repeat x-ray in a.m.                           - Return to GI clinic PRN. Will need twice a day                            MiraLAX long-term to keep stool from clogging  stent                           - Telephone GI clinic if symptomatic PRN. Consider                            elective surgical options in the future Procedure Code(s):        --- Professional ---                           [phone], 52, Colonoscopy, flexible; with endoscopic                            stent placement (includes pre- and post-dilation                            and guide wire passage, when performed) Diagnosis Code(s):        --- Professional ---                           K63.5, Polyp of colon                           D49.0, Neoplasm of unspecified behavior of                            digestive system                           K56.690, Other partial intestinal obstruction                           K56.609, Unspecified intestinal obstruction,                            unspecified as to partial versus complete                            obstruction                            K57.30, Diverticulosis of large intestine without                            perforation or abscess without bleeding                           R93.3, Abnormal findings on diagnostic imaging of                            other parts of digestive tract CPT copyright 2019 [employer]. All rights reserved. The codes documented in this report are preliminary and upon coder review may  be revised to meet current compliance requirements. Clarene Essex, MD 08/16/2019 2:15:33 PM This report has been signed electronically. Number of Addenda: 0

## 2019-08-16 NOTE — Anesthesia Postprocedure Evaluation (Signed)
Anesthesia Post Note  Patient: Mallory Mcpherson  Procedure(s) Performed: COLONOSCOPY WITH PROPOFOL (N/A ) COLONIC STENT PLACEMENT (N/A )     Patient location during evaluation: PACU Anesthesia Type: MAC Level of consciousness: awake and alert and oriented Pain management: pain level controlled Vital Signs Assessment: post-procedure vital signs reviewed and stable Respiratory status: spontaneous breathing, nonlabored ventilation and respiratory function stable Cardiovascular status: blood pressure returned to baseline Postop Assessment: no apparent nausea or vomiting Anesthetic complications: no    Last Vitals:  Vitals:   08/16/19 1422 08/16/19 1432  BP: (!) 161/74 (!) 146/70  Pulse: 87 86  Resp: 20 19  Temp: 37 C   SpO2: 94% 94%    Last Pain:  Vitals:   08/16/19 1432  TempSrc:   PainSc: [name]

## 2019-08-17 ENCOUNTER — Inpatient Hospital Stay (HOSPITAL_COMMUNITY): Payer: Medicare Other

## 2019-08-17 DIAGNOSIS — N289 Disorder of kidney and ureter, unspecified: Secondary | ICD-10-CM

## 2019-08-17 DIAGNOSIS — N179 Acute kidney failure, unspecified: Secondary | ICD-10-CM

## 2019-08-17 DIAGNOSIS — R140 Abdominal distension (gaseous): Secondary | ICD-10-CM

## 2019-08-17 LAB — CBC
HCT: 26.8 % — ABNORMAL LOW (ref 36.0–46.0)
Hemoglobin: 7.7 g/dL — ABNORMAL LOW (ref 12.0–15.0)
MCH: 23.7 pg — ABNORMAL LOW (ref 26.0–34.0)
MCHC: 28.7 g/dL — ABNORMAL LOW (ref 30.0–36.0)
MCV: 82.5 fL (ref 80.0–100.0)
Platelets: 282 10*3/uL (ref 150–400)
RBC: 3.25 MIL/uL — ABNORMAL LOW (ref 3.87–5.11)
RDW: 17.4 % — ABNORMAL HIGH (ref 11.5–15.5)
WBC: 4.1 10*3/uL (ref 4.0–10.5)
nRBC: 0 % (ref 0.0–0.2)

## 2019-08-17 LAB — BASIC METABOLIC PANEL
Anion gap: 8 (ref 5–15)
BUN: 42 mg/dL — ABNORMAL HIGH (ref 8–23)
CO2: 25 mmol/L (ref 22–32)
Calcium: 7.4 mg/dL — ABNORMAL LOW (ref 8.9–10.3)
Chloride: 105 mmol/L (ref 98–111)
Creatinine, Ser: 1.65 mg/dL — ABNORMAL HIGH (ref 0.44–1.00)
GFR calc Af Amer: 32 mL/min — ABNORMAL LOW (ref >60)
GFR calc non Af Amer: 27 mL/min — ABNORMAL LOW (ref >60)
Glucose, Bld: 59 mg/dL — ABNORMAL LOW (ref 70–99)
Potassium: 4 mmol/L (ref 3.5–5.1)
Sodium: 138 mmol/L (ref 135–145)

## 2019-08-17 LAB — GLUCOSE, CAPILLARY
Glucose-Capillary: 56 mg/dL — ABNORMAL LOW (ref 70–99)
Glucose-Capillary: 106 mg/dL — ABNORMAL HIGH (ref 70–99)
Glucose-Capillary: 62 mg/dL — ABNORMAL LOW (ref 70–99)
Glucose-Capillary: 197 mg/dL — ABNORMAL HIGH (ref 70–99)
Glucose-Capillary: 139 mg/dL — ABNORMAL HIGH (ref 70–99)
Glucose-Capillary: 145 mg/dL — ABNORMAL HIGH (ref 70–99)

## 2019-08-17 NOTE — Progress Notes (Signed)
Hypoglycemic Event  CBG: 56  Treatment: 4oz juice/soda   Symptoms: None   Follow-up CBG: Time: 0643 CBG Result: 62  Possible Reasons for Event: Inadequate meal intake  Comments/MD notified: Pt has been NPO for past three days. Pt asymptomatic. Will give another 4 oz of juice and recheck sugar in 15 minutes. Will continue to monitor.      Phineas Semen

## 2019-08-17 NOTE — Consult Note (Signed)
Consultation Note Date: 08/17/2019   Patient Name: Mallory Mcpherson  DOB: [birth year]  MRN: [id number]  Age / Sex: 85 y.o., female   PCP: System, Pcp Not In Referring Physician: Aldine Contes, MD   REASON FOR CONSULTATION:Establishing goals of care  Palliative Care consult requested for this 85 y.o. female with multiple medical problems including diabetes, hypertension, vertigo, and CKD. Patient presented to ED with complaints of abdominal pain and progressive abdominal distention. Patient lives in [place] and is here visiting with her sister for 2 weeks with plans of going on a family vacation with her daughter to [facility] [place]. CT abdomen/pelvis showed bowel obstruction in the ascending colon. Surgery has been following patient and also GI. Patient has NG tube placed with green colored content. She is s/p colonic stent. Patient is declining surgical intervention. Palliative Medicine Team consulted for goals of care.   Clinical Assessment and Goals of Care: I have reviewed medical records including lab results, imaging, Epic notes, and MAR, received report from the bedside RN, and assessed the patient. I met at the bedside with patient and her daughter Mallory Mcpherson) is here from [place]  to discuss diagnosis prognosis, [place], EOL wishes, disposition and options. Patient is sitting on the bedside, lunch tray has arrived. NG clamped. She denies pain or nausea. [name] is alert and oriented x3.   I introduced Palliative Medicine as specialized medical care for people living with serious illness. It focuses on providing relief from the symptoms and stress of a serious illness. The goal is to improve quality of life for both the patient and the family.  We discussed a brief life review of the patient, along with her functional and nutritional status. Patient lives in [place] alone. She has one daughter, who lives in [place]. Her sister lives in [place]. Patient is a retired Psychologist, sport and exercise  at a SNF. She is of [place] faith. She reports her husband passed away more than 20 years ago. She enjoys spending time with family and gardening.   Prior to admission patient reports she was living alone. She does not drive. She reports her neighbors and friends assisted with transportation to appointments and errands. She reports no use of assistive devices and the ability to provide all ADLs. She reports she has been followed by a Urologist with concerns of a renal mass.   We discussed Her current illness and what it means in the larger context of Her on-going co-morbidities. With specific discussions regarding her bowel obstruction, ulcerating mass, and her overall functional state. Natural disease trajectory and expectations at EOL were discussed. Patient verbalized understanding of her condition. She states she has thought in detail about her condition and awareness of possible cancer, including the need for surgical intervention.  [name] states she is aware of the need for surgery however she is not interested. She states she is not interested in surgery or oncology interventions or work-up. We discussed at detail what this would mean for her care, with specific discussions of possible continued bowel obstruction, pain, nausea, weight loss, and further progression of cancer. [name] verbalized understanding and states "I am ok with all of this. I want have lived a wonderful life and at my age I just want to spend time with my family and enjoy what time and moments we have left together!" Daughter is tearful. I encouraged daughter to share her thoughts as she had not said much during discussion. Daughter expressed she wished her mother would consider surgical  intervention however, she respects her decisions and would not convince her to change her mind. She states she has told me this is what she want and I support her. She is tearful stating the plan is for her mother to come back to [place]  with her once she is able to discharge and be with her and her daughter. Patient verbalized understanding.   Daughter request continued updates in regards to patient's ability to ride back to [place] at discharge. She states if patient has to remain in [place] with her sister until she is able to transport to [place].   I attempted to elicit values and goals of care important to the patient.    The difference between aggressive medical intervention and comfort care was considered in light of the patient's goals of care. Patient again states she would not want any surgical interventions or further Oncology work-up such as chemo or radiation. She states she would like to continue her current treatment with hopes of getting out of the hospital soon and getting to [place] with her daughter and granddaughter who is a sophomore in college and enjoy her family until she gets to ill to be able to do anything.   Patient states she does not have an advanced directive. She reports she only has the one child and her daughter knows her wishes. She reports she is confident her daughter will support her wishes and make the best decisions if needed. We discussed concepts specific to code status, artifical feeding and hydration, and rehospitalization. I discussed with patient and daughter at length regarding her current full code status. Patient states she would not want to undergo CPR or life support. I explained to patient based on her expressed wishes it would be recommended to change her code status to DNR/DNI. Daughter became emotional. She began stating to her mother "mom please don't make a rash decision immediately. You should really think about what you are saying and asking for. I don't want you to make this decision right now, please wait!" Patient tearful, stating she would not like to change her code status at this time. Daughter states patient did have a DNR bracelet on when she arrived on last week and she  (daughter) stated she informed medical team to remove bracelet this was not accurate. I discussed further with patient and daughter educating them on what a potential code would look like in the event patient suffered a cardiopulmonary event. I discussed with patient and daughter poor prognosis and chances of survival. We discussed that her overall condition (ulcerating mass) would remain a concern and patient could further suffer additional trauma and/or suffering. Patient verbalized understanding. They also was educated while patient and daughter remains in discussion if an event occurred aggressive measures would be taken as she would be considered a full code. Daughter verbalized understanding. Patient again states she would not wish to change her code status to allow her daughter time to process all of her health concerns.   Patient states she would not be interested in artifical feedings long-term.   Hospice and Palliative Care services outpatient were explained and offered. Patient and family verbalized their understanding and awareness of both palliative and hospice's goals and philosophy of care. Patient and daughter verbalized their intentions to initiate hospice services once patient has relocated to [place]. Daughter reports she would then contact appropriate agencies for further assistance.   Questions and concerns were addressed.  Hard Choices booklet left for review and printed materials explaining  hospice and palliative services. The family was encouraged to call with questions or concerns.  PMT will continue to support holistically.  Patient ate her lunch tray while at the bedside during meeting. She tolerated well without complications. Denied nausea or pain, stating she really enjoyed the potato soup.    SOCIAL HISTORY:       CODE STATUS: Full code  ADVANCE DIRECTIVES: Next of Kin    SYMPTOM MANAGEMENT: per attending   Palliative Prophylaxis:   Bowel Regimen, Delirium  Protocol, Frequent Pain Assessment and Oral Care  PSYCHO-SOCIAL/SPIRITUAL:  Support System: Family   Desire for further Chaplaincy support: NO   Additional Recommendations (Limitations, Scope, Preferences):  No Artificial Feeding and continue current treatments, no surgical or oncology interventions.    PAST MEDICAL HISTORY: Past Medical History:  Diagnosis Date   Complication of anesthesia    "wake up sklow"   Diabetes mellitus without complication ([place])    Hypertension     PAST SURGICAL HISTORY: History reviewed. No pertinent surgical history.  ALLERGIES:  has No Known Allergies.   MEDICATIONS:  Current Facility-Administered Medications  Medication Dose Route Frequency Provider Last Rate Last Dose   0.9 %  sodium chloride infusion   Intravenous Continuous Clarene Essex, MD 100 mL/hr at 08/16/19 0055     acetaminophen (TYLENOL) tablet 650 mg  650 mg Oral Q6H PRN Clarene Essex, MD   650 mg at 08/14/19 0303   Or   acetaminophen (TYLENOL) suppository 650 mg  650 mg Rectal Q6H PRN Clarene Essex, MD       aspirin EC tablet 81 mg  81 mg Oral Daily Clarene Essex, MD   81 mg at 08/17/19 0931   enoxaparin (LOVENOX) injection 30 mg  30 mg Subcutaneous Q24H Clarene Essex, MD   30 mg at 08/17/19 0931   HYDROmorphone (DILAUDID) injection 0.5 mg  0.5 mg Intravenous Q4H PRN Clarene Essex, MD   0.5 mg at 08/17/19 1211   insulin aspart (novoLOG) injection 0-9 Units  0-9 Units Subcutaneous TID WC Clarene Essex, MD   2 Units at 08/17/19 1211   ondansetron (ZOFRAN) tablet 4 mg  4 mg Oral Q6H PRN Clarene Essex, MD       Or   ondansetron [facility]) injection 4 mg  4 mg Intravenous Q6H PRN Clarene Essex, MD       pantoprazole (PROTONIX) injection 40 mg  40 mg Intravenous Q24H Clarene Essex, MD   40 mg at 08/17/19 0931   phenol (CHLORASEPTIC) mouth spray 1 spray  1 spray Mouth/Throat PRN Clarene Essex, MD       polyethylene glycol (MIRALAX / GLYCOLAX) packet 17 g  17 g Oral BID Clarene Essex, MD   17 g at  08/17/19 0931   simvastatin (ZOCOR) tablet 40 mg  40 mg Oral Daily Clarene Essex, MD   40 mg at 08/17/19 0931    VITAL SIGNS: BP 132/66 (BP Location: Left Arm)    Pulse 98    Temp 97.9 F (36.6 C) (Oral)    Resp (!) 23    Ht '5\' 3"'  (1.6 m)    Wt 84.2 kg    SpO2 97%    BMI 32.88 kg/m  Filed Weights   08/13/19 2306 08/17/19 0344  Weight: 83.8 kg 84.2 kg    Estimated body mass index is 32.88 kg/m as calculated from the following:   Height as of this encounter: '5\' 3"'  (1.6 m).   Weight as of this encounter: 84.2 kg.  LABS: CBC:  Component Value Date/Time   WBC 4.1 08/17/2019 0246   HGB 7.7 (L) 08/17/2019 0246   HCT 26.8 (L) 08/17/2019 0246   PLT 282 08/17/2019 0246   Comprehensive Metabolic Panel:    Component Value Date/Time   NA 138 08/17/2019 0246   K 4.0 08/17/2019 0246   CO2 25 08/17/2019 0246   BUN 42 (H) 08/17/2019 0246   CREATININE 1.65 (H) 08/17/2019 0246   ALBUMIN 3.1 (L) 08/13/2019 1104     Review of Systems  Constitutional: Positive for fatigue.  Neurological: Positive for weakness.   Physical Exam General: NAD Cardiovascular: regular rate and rhythm Pulmonary: clear ant fields bilaterally  Abdomen: soft, nontender, + bowel sounds, NG tube clamped Extremities: no edema, no joint deformities Skin: no rashes Neurological: A&O x3, mood appropriate    Prognosis: Unable to determine-Guarded to Poor in the setting of bowel obstruction, renal mass, ulcerating mass (patient declining surgical interventions or further Oncology work-up or interventions such as chemo or radiation), renal insufficiency, anemia.   Discharge Planning:  At patient's request home with daughter in [place]. Daughter planning to set-up hospice support once they arrive.   Recommendations:  Full Code-at patient's request would like to continue to discuss with daughter who is not accepting of DNR/DNI at this time.   Continue with current plan of care per attending   Patient declining  surgical interventions, chemotherapy, or radiation therapy (Oncological interventions)  Outpatient hospice however, patient is planning to relocate to [place] with daughter if able once discharge. Planning to initiate services once they have gotten there and settled.   PMT will continue to support and follow.    Palliative Performance Scale: PPS 30%                 Patient and daughter expressed understanding and was in agreement with this plan.   Thank you for allowing the Palliative Medicine Team to assist in the care of this patient.  Time In: 1100 Time Out: 1215 Time Total: 75 min.   Visit consisted of counseling and education dealing with the complex and emotionally intense issues of symptom management and palliative care in the setting of serious and potentially life-threatening illness.Greater than 50%  of this time was spent counseling and coordinating care related to the above assessment and plan.  Signed by:  Alda Lea, AGPCNP-BC Palliative Medicine Team  Phone: [phone] Fax: [phone] Pager: [phone] Amion: Bjorn Pippin

## 2019-08-17 NOTE — Progress Notes (Signed)
Internal Medicine Attending:   I saw and examined the patient. I reviewed the resident's note and I agree with the resident's findings and plan as documented in the resident's note.  Patient states that her abdominal distention and pain are continuing to improve.  She denies any nausea this morning and is tolerating clear liquid diet.  She states that she has had 2 small bowel movements since her stent placement.  Patient is initially made to the hospital with a large bowel obstruction likely secondary to a malignant mass.  Patient is status post colonoscopy with placement of a colonic stent.  Repeat abdominal x-ray today shows improving proximal colon instruction.  Patient tolerating oral intake.  We will continue to advance diet slowly as tolerated.  Will DC NG tube tomorrow if patient continues to tolerate a diet.  Continue with Zofran as needed as well as pain control as needed.  Will obtain palliative care consult per patient's wishes.  Surgery follow-up recommendations appreciated.  Patient is not interested in surgical intervention at this time.  She clearly states that she wants to be "comfortable" and does not want aggressive measures.  Patient also had AKI on admission and her creatinine continues to improve.  Her creatinine is down to 1.65 today.  We will continue to monitor closely.  Patient also noted to be anemic with a hemoglobin of 7.7.  Will monitor CBC daily and transfuse for hemoglobin less than 7.  I suspect that her anemia is secondary to her underlying malignancy.  However, she does not have any acute bleeding going on currently.  We will continue to monitor closely.

## 2019-08-17 NOTE — Progress Notes (Signed)
[facility] Gastroenterology Progress Note  Mallory Mcpherson 85 y.o. 1928-12-27  CC: Colonic obstruction   Subjective: Underwent colonoscopy with stent placement yesterday.  Feeling better today.  Abdominal less distended.  NG tube clamped  ROS : Negative for chest pain and shortness of breath   Objective: Vital signs in last 24 hours: Vitals:   08/17/19 0929 08/17/19 1152  BP: 130/67 132/66  Pulse: 95 98  Resp: 18 (!) 23  Temp: 97.8 F (36.6 C) 97.9 F (36.6 C)  SpO2: 95% 97%    Physical Exam:  General.  Elderly patient resting comfortably in the bed.   Abdomen.  Only minimal discomfort on right side with palpation, abdomen is less distended, bowel sounds present. Neuro.  Alert/oriented x3 Psych.  Mood and affect normal  Lab Results: Recent Labs    08/16/19 0446 08/17/19 0246  NA 139 138  K 4.1 4.0  CL 106 105  CO2 25 25  GLUCOSE 70 59*  BUN 50* 42*  CREATININE 1.94* 1.65*  CALCIUM 7.6* 7.4*   No results for input(s): AST, ALT, ALKPHOS, BILITOT, PROT, ALBUMIN in the last 72 hours. Recent Labs    08/16/19 0446 08/17/19 0246  WBC 4.4 4.1  NEUTROABS 2.6  --   HGB 8.3* 7.7*  HCT 28.7* 26.8*  MCV 81.5 82.5  PLT 288 282   No results for input(s): LABPROT, INR in the last 72 hours.    Assessment/Plan: -Colonic obstruction with transition point at distal ascending colon near hepatic flexure.  CT scan imaging reviewed personally.  Colonoscopy yesterday showed mass at hepatic flexure.  Stent was placed. -Anemia.  No overt bleeding. -Chronic kidney disease  Recommendations ------------------------ -Need for surgery for obstructing colonic mass discussed with the patient.  Patient does not want any surgical intervention at this time.  She would like to discuss this further with her family after discharge.  -Okay to slowly advance diet as tolerated from GI standpoint.  Consider removing NG tube later today or tomorrow if tolerating diet.  -No further inpatient GI  work-up planned.  GI will sign off.  Call us back if needed  Otis Brace MD, [place] 08/17/2019, 12:35 PM  Contact #  [phone]

## 2019-08-17 NOTE — Progress Notes (Signed)
Subjective: Patient seen at bedside this morning. She reports having bowel movements overnight.  She does not feel nauseous this morning and is tolerating clear fluids.  Had 2 small bowel movements since the stent placement.  The team informed the patient on the mass found in her intestines and that is likely cancer. Patient states "I'm 85 years old, don't have much to live long anyway," and states she wants to be comfortable. Talked about benefit of talking with palliative care. Patient states she will likely go to [place] to live with daughter  after she leaves the hospital.  Objective:  Vital signs in last 24 hours: Vitals:   08/16/19 2317 08/17/19 0342 08/17/19 0344 08/17/19 0355  BP: (!) 120/49 110/60    Pulse: 72 80  80  Resp: 12 (!) 24  17  Temp: 98.4 F (36.9 C) 98 F (36.7 C)    TempSrc: Oral Oral    SpO2: 98% 99%  96%  Weight:   84.2 kg   Height:       Physical Exam: [place]: Appears comfortable, resting in bed HEENT: NCAT, clamped NG tube in place CV: RRR, normal S1 and S2 no murmurs rubs or gallops appreciated PULM: Clear to auscultation in all lung fields bilaterally ABD: Soft, no tenderness to palpation NEURO: Alert and oriented, no focal deficits appreciated  Assessment/Plan:  Principal Problem:   Bowel obstruction (HCC) Active Problems:   Diabetes ([place])   Renal insufficiency   Leukocytosis   Anemia   Renal mass, right  In summary, Mallory Mcpherson is a 85 year old female with a history notable for CKD, T2DM, HTN and vertigo who presented with abdominal pain distention. Imaging was notable for moderate grade obstruction of the ascending colon with a distinct transition point.  The patient underwent successful stent placement with GI yesterday.  A 5 cm ulcerating mass was seen partially obstructing the bowel at the hepatic flexure, likely a malignant tumor. The patient was informed of this and stated that she did not want surgical intervention would rather focus on  comfort moving forward.  #Suspected malignancy  #Large Bowel Obstruction: In light of this new 5 cm ulcerating mass in the large bowel at the hepatic flexure, the patient has underwent stent placement and has had resolution of her abdominal pain, nausea and has had some bowel movements.  She is not interested in a surgical intervention at this time and is made this clear that she would rather focus on comfort measures.  She has made her wishes known with a GI and surgical team who have signed off at this time. She wants to talk to the palliative team.  - Palliative care consult ordered - NG tube in place but clamped. Will consider pulling NG tube tomorrow if patient tolerates diet.  - Zofran 4 mg q6hrs PRN for nausea - Continue Dilaudid 0.5 mg q3hrs PRN for abdominal pain   #Anemia: Iron studies significant for low iron (9). S/p 1 dose of ferriheme on 9/20. CBC demonstrated low Hgb today of 7.7. Will continue to monitor.  - Daily CBCs - Consider transfusion if hemoglobin <7  #HTN: Enalapril 10 mg daily at home. - Holding Enalapril in the setting of acute illness  #T2DM: A1c 9.3 on admission. Pt takes sitagliptin 25 mg daily, glimepiride 2mg  daily at home.  -SSI  #FEN/GI - Diet: Full liquid - IVF: NS 100 cc/hr   #DVT Prophylaxis - Lovenox 30 mg subq injections daily. If patient shows signs of bleeding will discontinue.  Dispo: Pending medical course. Patient currently lives in [place] but states that she will likely end up moving in with her daughter in [place] in light of this new suspected cancer.  Mallory Plater, MD Internal Medicine, PGY1 Pager: [phone]  08/17/2019,9:15 AM

## 2019-08-17 NOTE — Progress Notes (Signed)
[facility] Progress Note  1 Day Post-Op  Subjective: Patient reports mild abdominal pain this AM that is much improved from yesterday. Denies nausea or vomiting. Tolerating CLD. +flatus and BM. Reports BMs are loose. Patient reports she does not think that she wants surgery for colon cancer at 85 years old.   Objective: Vital signs in last 24 hours: Temp:  [97.8 F (36.6 C)-98.6 F (37 C)] 97.8 F (36.6 C) (09/22 0929) Pulse Rate:  [72-99] 95 (09/22 0929) Resp:  [12-24] 18 (09/22 0929) BP: (110-161)/(49-74) 130/67 (09/22 0929) SpO2:  [94 %-100 %] 95 % (09/22 0929) Weight:  [84.2 kg] 84.2 kg (09/22 0344) Last BM Date: 08/16/19  Intake/Output from previous day: 09/21 0701 - 09/22 0700 In: 1580 [I.V.:1580] Out: -  Intake/Output this shift: No intake/output data recorded.  PE: Gen:  Alert, NAD, pleasant Card:  Regular rate and rhythm, pedal pulses 2+ BL Pulm:  Normal effort, clear to auscultation bilaterally Abd: non-tender, no rebound ttp or guarding, less distended, BS less hypoactive Skin: warm and dry, no rashes  Psych: A&Ox3   Lab Results:  Recent Labs    08/16/19 0446 08/17/19 0246  WBC 4.4 4.1  HGB 8.3* 7.7*  HCT 28.7* 26.8*  PLT 288 282   BMET Recent Labs    08/16/19 0446 08/17/19 0246  NA 139 138  K 4.1 4.0  CL 106 105  CO2 25 25  GLUCOSE 70 59*  BUN 50* 42*  CREATININE 1.94* 1.65*  CALCIUM 7.6* 7.4*   PT/INR No results for input(s): LABPROT, INR in the last 72 hours. CMP     Component Value Date/Time   NA 138 08/17/2019 0246   K 4.0 08/17/2019 0246   CL 105 08/17/2019 0246   CO2 25 08/17/2019 0246   GLUCOSE 59 (L) 08/17/2019 0246   BUN 42 (H) 08/17/2019 0246   CREATININE 1.65 (H) 08/17/2019 0246   CALCIUM 7.4 (L) 08/17/2019 0246   PROT 6.3 (L) 08/13/2019 1104   ALBUMIN 3.1 (L) 08/13/2019 1104   AST 19 08/13/2019 1104   ALT 20 08/13/2019 1104   ALKPHOS 47 08/13/2019 1104   BILITOT 0.8 08/13/2019 1104   GFRNONAA 27 (L)  08/17/2019 0246   GFRAA 32 (L) 08/17/2019 0246   Lipase     Component Value Date/Time   LIPASE 23 08/13/2019 1104       Studies/Results: Dg Abd 1 View  Result Date: 08/16/2019 CLINICAL DATA:  Abdominal pain and distension. EXAM: ABDOMEN - 1 VIEW COMPARISON:  CT scan 08/13/2019 and abdominal x-ray 08/15/2019 FINDINGS: Persistent air distended small bowel loops and cecum consistent with obstruction. No obvious free air. The NG tube is not identified. IMPRESSION: Persistent obstructive bowel gas pattern. Electronically Signed   By: Marijo Sanes M.D.   On: 08/16/2019 08:10   Dg Abd 2 Views  Result Date: 08/16/2019 CLINICAL DATA:  Colonic stent placement. EXAM: ABDOMEN - 2 VIEW; DG C-ARM 1-60 MIN COMPARISON:  One-view abdomen 08/16/2019 FINDINGS: Intraoperative imaging demonstrates a stent in the right lower quadrant. Stent is narrowed centrally. Dilated loops of small bowel remain. IMPRESSION: 1. Interval placement of proximal colonic stent. 2. Persistent small bowel obstruction. Electronically Signed   By: San Morelle M.D.   On: 08/16/2019 15:18   Dg C-arm 1-60 Min  Result Date: 08/16/2019 CLINICAL DATA:  Colonic stent placement. EXAM: ABDOMEN - 2 VIEW; DG C-ARM 1-60 MIN COMPARISON:  One-view abdomen 08/16/2019 FINDINGS: Intraoperative imaging demonstrates a stent in the right lower quadrant.  Stent is narrowed centrally. Dilated loops of small bowel remain. IMPRESSION: 1. Interval placement of proximal colonic stent. 2. Persistent small bowel obstruction. Electronically Signed   By: San Morelle M.D.   On: 08/16/2019 15:18    Anti-infectives: Anti-infectives (From admission, onward)   Start     Dose/Rate Route Frequency Ordered Stop   08/13/19 2200  cephALEXin (KEFLEX) capsule 250 mg  Status:  Discontinued     250 mg Oral 2 times daily 08/13/19 2119 08/14/19 1302   08/13/19 1545  piperacillin-tazobactam (ZOSYN) IVPB 4.5 g  Status:  Discontinued     4.5 g 200 mL/hr over  30 Minutes Intravenous  Once 08/13/19 1541 08/13/19 1543   08/13/19 1545  piperacillin-tazobactam (ZOSYN) IVPB 4.5 g     4.5 g 200 mL/hr over 30 Minutes Intravenous  Once 08/13/19 1544 08/13/19 1718   08/13/19 1545  piperacillin-tazobactam (ZOSYN) IVPB 3.375 g  Status:  Discontinued     3.375 g 100 mL/hr over 30 Minutes Intravenous  Once 08/13/19 1544 08/13/19 1938       Assessment/Plan HTN T2DM - SSI R renal mass - non-emergent MRI recommended for better evaluation  ARF - improving Anemia - hgb 7.7  Ascending colon stricture (?mass) with dilated R colon - CEA 7.1, suggestive of underlying malignancy - s/p stent placement 9/21 Dr. Watt Climes - film this AM pending but on preliminary read appears slightly improved from yesterday  - patient stating that she does not feel she wants to undergo surgery - consider palliative consult to more clearly outline goals of care - diet advancement and NGT removal per GI, seems ok to remove and advance slowly from a surgical standpoint as long as she is having bowel function and not nauseated/distended  FEN: FLD, NGT clamped VTE: SCDs ID: none currently  LOS: 4 days    Brigid Re , [facility] 08/17/2019, 9:36 AM Pager: [phone] Consults: [phone] 7:00 AM - 4:30 PM M, W-F 7:00 AM - 11:30 AM Tues, Sat, Sun

## 2019-08-18 ENCOUNTER — Encounter (HOSPITAL_COMMUNITY): Payer: Self-pay | Admitting: Gastroenterology

## 2019-08-18 DIAGNOSIS — Z515 Encounter for palliative care: Secondary | ICD-10-CM

## 2019-08-18 DIAGNOSIS — C189 Malignant neoplasm of colon, unspecified: Secondary | ICD-10-CM

## 2019-08-18 DIAGNOSIS — Z7189 Other specified counseling: Secondary | ICD-10-CM

## 2019-08-18 LAB — BASIC METABOLIC PANEL
Anion gap: 9 (ref 5–15)
BUN: 29 mg/dL — ABNORMAL HIGH (ref 8–23)
CO2: 23 mmol/L (ref 22–32)
Calcium: 7.5 mg/dL — ABNORMAL LOW (ref 8.9–10.3)
Chloride: 104 mmol/L (ref 98–111)
Creatinine, Ser: 1.4 mg/dL — ABNORMAL HIGH (ref 0.44–1.00)
GFR calc Af Amer: 39 mL/min — ABNORMAL LOW (ref >60)
GFR calc non Af Amer: 33 mL/min — ABNORMAL LOW (ref >60)
Glucose, Bld: 113 mg/dL — ABNORMAL HIGH (ref 70–99)
Potassium: 4 mmol/L (ref 3.5–5.1)
Sodium: 136 mmol/L (ref 135–145)

## 2019-08-18 LAB — GLUCOSE, CAPILLARY
Glucose-Capillary: 109 mg/dL — ABNORMAL HIGH (ref 70–99)
Glucose-Capillary: 114 mg/dL — ABNORMAL HIGH (ref 70–99)
Glucose-Capillary: 232 mg/dL — ABNORMAL HIGH (ref 70–99)

## 2019-08-18 LAB — CBC
HCT: 24.9 % — ABNORMAL LOW (ref 36.0–46.0)
Hemoglobin: 7.3 g/dL — ABNORMAL LOW (ref 12.0–15.0)
MCH: 23.4 pg — ABNORMAL LOW (ref 26.0–34.0)
MCHC: 29.3 g/dL — ABNORMAL LOW (ref 30.0–36.0)
MCV: 79.8 fL — ABNORMAL LOW (ref 80.0–100.0)
Platelets: 282 10*3/uL (ref 150–400)
RBC: 3.12 MIL/uL — ABNORMAL LOW (ref 3.87–5.11)
RDW: 17.4 % — ABNORMAL HIGH (ref 11.5–15.5)
WBC: 6.1 10*3/uL (ref 4.0–10.5)
nRBC: 0 % (ref 0.0–0.2)

## 2019-08-18 MED ORDER — PANTOPRAZOLE SODIUM 40 MG PO TBEC: 40.0000 mg | DELAYED_RELEASE_TABLET | Freq: Every day | ORAL | Status: DC

## 2019-08-18 MED ORDER — OXYCODONE-ACETAMINOPHEN 5-325 MG PO TABS: 1.0000 | ORAL_TABLET | Freq: Four times a day (QID) | ORAL | 0 refills | Status: AC | PRN

## 2019-08-18 MED ORDER — OXYCODONE-ACETAMINOPHEN 5-325 MG PO TABS: 1.0000 | ORAL_TABLET | ORAL | Status: DC | PRN

## 2019-08-18 MED ORDER — PANTOPRAZOLE SODIUM 40 MG PO TBEC: 40.0000 mg | DELAYED_RELEASE_TABLET | Freq: Every day | ORAL | 0 refills | Status: AC

## 2019-08-18 MED ORDER — ONDANSETRON HCL 4 MG PO TABS: 4.0000 mg | ORAL_TABLET | Freq: Four times a day (QID) | ORAL | 0 refills | Status: AC | PRN

## 2019-08-18 MED FILL — OXYCODONE W/APAP 5/325 TAB: 5-325 | 5 days supply | Qty: 20 | Fill #0

## 2019-08-18 MED FILL — PANTOPRAZOLE SOD DR 40 MG T: 40 | 30 days supply | Qty: 30 | Fill #0

## 2019-08-18 MED FILL — ONDANSETRON HCL 4 MG TABLET: 4 | 7 days supply | Qty: 30 | Fill #0

## 2019-08-18 NOTE — Progress Notes (Signed)
Patient ID: Mallory Mcpherson, female   DOB: 15-Oct-1929, 85 y.o.   MRN: [id number]  85 y.o. female with multiple medical problems including diabetes, hypertension, vertigo, and CKD. Patient presented to ED with complaints of abdominal pain and progressive abdominal distention.   Patient lives in [place] and is here visiting with her sister for 2 weeks with plans of going on a family vacation with her daughter to [facility].  CT abdomen/pelvis showed bowel obstruction in the ascending colon. Surgery has been following patient and also GI. Patient has NG tube placed with green colored content. She is s/p colonic stent. Patient is declining surgical intervention. Palliative Medicine Team consulted for goals of care  This NP visited patient at the bedside as a follow up for palliative medicine needs and emotional support at the request of the attending.  Patient was seen yesterday by my coworker for initial palliative goals of care meeting.    Today family have questions and concerns regarding disposition and transition of care.   Coordinated with Dr. Sheppard Coil to  return to the bedside to answer questions and concerns for the patient and her Sister Mallory Mcpherson.  I arranged to have her daughter Mallory Mcpherson phone number [phone] to come to the bedside for continued conversation regarding goals of care and transitions of care and anticipatory care needs..  After discussion plan is for the patient to discharge today to her sister's house here in [place], follow-up as outpatient within 5 to 7 days with Dr. Redgie Grayer team and then if stable at that time patient's daughter will take Mallory Mcpherson home with her to [place].  I encouraged daughter to secure PCP and appropriate follow-up physician visits in [place],  so that things are set up into the future when the patient arrives in [place].  I did raise awareness to hospice benefit.  Discussed with patient/family  the importance of continued  conversation with each other  and their  medical providers regarding overall plan of care and treatment options,  ensuring decisions are within the context of the patients values and GOCs.    Encourage patient and family to discuss and document advance care planning and healthcare power of attorney.  Questions and concerns addressed   Discussed with Dr Sheppard Coil and [facility]  Total time spent on the unit was 45 minutes  Greater than 50% of the time was spent in counseling and coordination of care  Wadie Lessen NP  Palliative Medicine Team Team Phone # [phone] Pager [phone]

## 2019-08-18 NOTE — Progress Notes (Signed)
Subjective: Patient seen at bedside this morning.  The patient said she had larger bowel movements overnight and feels well overall.  She is looking forward to getting the NG tube pulled today. Says her appetite is good and has no nausea or vomiting.  No complaints at this time.    Objective:  Vital signs in last 24 hours: Vitals:   08/18/19 0001 08/18/19 0430 08/18/19 0830 08/18/19 1119  BP: 126/64 (!) 125/50 120/60 (!) 138/48  Pulse: 90 80 71 92  Resp: 20 19 12  (!) 25  Temp: 98.9 F (37.2 C) 99 F (37.2 C) 98.4 F (36.9 C) 97.6 F (36.4 C)  TempSrc: Oral Oral Oral Oral  SpO2: 96% 96% 97% 95%  Weight:  86.2 kg    Height:       Physical Exam: Genral: Appears comfortable, sitting up in bed ready breakfast HEENT: NCAT, clamped NG tube in place CV: RRR, normal S1 and S2 no murmurs rubs or gallops appreciated PULM: Clear to auscultation bilaterally, no crackles or wheezes ABD: Soft and nontender NEURO: Alert and oriented, no focal deficits appreciated  Assessment/Plan:  Principal Problem:   Colon cancer (HCC) Active Problems:   Bowel obstruction (HCC)   Diabetes (HCC)   Renal insufficiency   Leukocytosis   Anemia   Renal mass, right   Palliative care by specialist   DNR (do not resuscitate) discussion  In summary, Mallory Mcpherson is a 85 year old female with a history notable for CKD, T2DM, HTN and vertigo who presented with abdominal pain distention. Imaging was notable for moderate grade obstruction of the ascending colon with a distinct transition point. The patient underwent successful stent placement with GI yesterday. A 5 cm ulcerating mass was seen partially obstructing the bowel at the hepatic flexure, likely a malignant tumor. The patient was informed of this and stated that she did not want surgical intervention or chemotherapy at this time and would rather focus on comfort moving forward. The patient says she understands that nonsurgical interventions would only be  palliative and that the cancer would likely progress and take her life.    #Suspected malignancy  #Large Bowel Obstruction: In light of this new 5 cm ulcerating mass in the large bowel at the hepatic flexure, the patient has underwent stent placement and has had resolution of her abdominal pain, nausea and has had some bowel movements.  She is not interested in a surgical or chemotherapeutic intervention at this time and is made this clear that she would rather focus on comfort measures. She has made her wishes known with a GI and surgical team who have signed off at this time. Palliative team is on board and we appreciate their recommendations.  - After discussing with palliative care, the patient's wish is to be discharged to her sister's house here in [place] and follow-up with internal medicine in the outpatient setting to make sure she is stable enough to travel to [place] to live with her daughter indefinitely. - Remove NG tube - D/c Dilaudid.  Will discharge with 5 days worth of Percocet 5 mg q6hrs PRN - PT/OT consults recommend home health   #Anemia: Iron studies significant for low iron (9). S/p 1 dose of ferriheme on 9/20. CBC demonstrated low Hgb today of 7.3 from 7.7 yesterday.  Given the patient's ulcerative mass in her colon, this may suggest a slow bleed.  She is currently hemodynamically stable.  We will follow this up in the outpatient setting. - Follow-up appointment with internal medicine  outpatient clinic scheduled for 9/25 at 10:15 AM - Consider transfusion if hemoglobin <7  #HTN: Enalapril 10 mg daily at home. - Continue enalapril upon discharge  #T2DM: A1c 9.3 on admission. Pt takes sitagliptin 25 mg daily, glimepiride 2mg  daily at home.  -SSI  #FEN/GI - Diet: Regular diet as tolerated - IVF: none  #DVT Prophylaxis - Lovenox 30 mg subq injections daily  Dispo: Discharge home with home health  Earlene Plater, MD Internal Medicine, PGY1 Pager: [phone]   08/18/2019,4:19 PM

## 2019-08-18 NOTE — Progress Notes (Addendum)
Occupational Therapy Evaluation Patient Details Name: Mallory Mcpherson MRN: [id number] DOB: [birth year] Today's Date: 08/18/2019    History of Present Illness 85 y.o.femalewith multiple medical problems including diabetes, hypertension, vertigo, and CKD. Patient presented to ED with complaints of abdominal pain and progressive abdominal distention.CT abdomen/pelvis showed bowel obstruction in the ascending colon. She is s/p colonic stent   Clinical Impression   PTA, pt was living at home alone and reports she was independent with ADL/IADL and functional mobility with intermittent use of RW. Pt plans to d/c home to her sister's house (2 steps to enter, single level) and then her daughter will pick her up and bring her to live with her in [place] (single level with level entry). Pt currently requires minguard-minA with ADL and functional mobility at RW level. Pt demonstrates difficulty clearing simulated tub level height, educated pt and pt's sister on strategies to increase pt's safety and reduce risk for falls in bathroom and throughout day. Feel pt would benefit from OT services once in more stable living situation to maximize safety and independence with ADL and functional mobility and promote safe aging in place. Due to decline in current level of function, pt would benefit from acute OT to address established goals to facilitate safe D/C to venue listed below. At this time, recommend HHOT follow-up. Will continue to follow acutely should pt not d/c this date.      Follow Up Recommendations  Home health OT;Supervision - Intermittent(with mobility)    Equipment Recommendations  None recommended by OT    Recommendations for Other Services       Precautions / Restrictions Precautions Precautions: Fall Restrictions Weight Bearing Restrictions: No      Mobility Bed Mobility Overal bed mobility: Needs Assistance Bed Mobility: Supine to Sit     Supine to sit: Min assist;HOB elevated      General bed mobility comments: required support from therapist to progress trunk upright  Transfers Overall transfer level: Needs assistance Equipment used: None Transfers: Sit to/from Stand Sit to Stand: Min guard;Min assist         General transfer comment: minA for stability in standing    Balance Overall balance assessment: Mild deficits observed, not formally tested(pt with intermittent staggering)                                         ADL either performed or assessed with clinical judgement   ADL Overall ADL's : Needs assistance/impaired Eating/Feeding: Set up;Sitting   Grooming: Min guard;Standing   Upper Body Bathing: Min guard;Standing   Lower Body Bathing: Min guard;Sit to/from stand   Upper Body Dressing : Set up;Sitting   Lower Body Dressing: Min guard;Sit to/from stand Lower Body Dressing Details (indicate cue type and reason): pt donned shoes (slip on with backs) Toilet Transfer: Min guard;Ambulation Toilet Transfer Details (indicate cue type and reason): simulated Toileting- Clothing Manipulation and Hygiene: Min guard;Sit to/from stand   Tub/ Shower Transfer: Minimal assistance;Shower Scientist, research (medical) Details (indicate cue type and reason): pt demonstrated difficulty clearing step, bumping her toe with ascending, educated pt on importance of having family with her during transfers, pt expressed she may sponge bathe until her strength is increased Functional mobility during ADLs: Min guard;Minimal assistance General ADL Comments: provided pt with gait belt to allow family to assist with safe transfers, educated pt on compensatory/adaptive strategies during ADL to increase safety adn  decreased risk of falls     Vision         Perception     Praxis      Pertinent Vitals/Pain Pain Assessment: No/denies pain     Hand Dominance Right   Extremity/Trunk Assessment Upper Extremity Assessment Upper Extremity Assessment:  Generalized weakness   Lower Extremity Assessment Lower Extremity Assessment: Defer to PT evaluation   Cervical / Trunk Assessment Cervical / Trunk Assessment: Normal   Communication Communication Communication: No difficulties   Cognition Arousal/Alertness: Awake/alert Behavior During Therapy: WFL for tasks assessed/performed Overall Cognitive Status: Within Functional Limits for tasks assessed                                 General Comments: decreased safety awareness and decreased awareness of deficits, pt appears to push herself as opposed to taking rest breaks prn   General Comments  pt's sister present during session;pt ambulated in hallway and practiced stairs, HR up to 121 SpO2 91% following mobiltiy, required pursed lip breathing 42min to return O2 93%    Exercises     Shoulder Instructions      Home Living Family/patient expects to be discharged to:: Private residence Living Arrangements: Other relatives Available Help at Discharge: Family Type of Home: House Home Access: Stairs to enter Technical brewer of Steps: 2 Entrance Stairs-Rails: Can reach both Home Layout: One level     Bathroom Shower/Tub: Teacher, early years/pre: Standard     Home Equipment: Environmental consultant - 4 wheels;Cane - single point;Shower seat   Additional Comments: Pt plans to d.c to sister's home and then her daughter will pick her up and take her to live at daughter's house in [place]. Daughter has a level entry and single level house with walkin shower and shower seat      Prior Functioning/Environment Level of Independence: Independent        Comments: pt was living alone and independent with all ADL and intermittent use of RW        OT Problem List: Decreased strength;Decreased range of motion;Decreased activity tolerance;Decreased safety awareness;Decreased knowledge of use of DME or AE;Cardiopulmonary status limiting activity      OT  Treatment/Interventions: Self-care/ADL training;Therapeutic exercise;Energy conservation;DME and/or AE instruction;Therapeutic activities;Patient/family education;Balance training    OT Goals(Current goals can be found in the care plan section) Acute Rehab OT Goals Patient Stated Goal: to maintain safety with mobiltiy OT Goal Formulation: With patient Time For Goal Achievement: 09/01/19 Potential to Achieve Goals: Good ADL Goals Pt Will Perform Grooming: Independently;standing;sitting Pt Will Perform Upper Body Dressing: Independently Pt Will Perform Lower Body Dressing: Independently Pt Will Transfer to Toilet: Independently Pt Will Perform Tub/Shower Transfer: with modified independence;Tub transfer  OT Frequency: Min 2X/week   Barriers to D/C:            Co-evaluation PT/OT/SLP Co-Evaluation/Treatment: Yes Reason for Co-Treatment: For patient/therapist safety;To address functional/ADL transfers   OT goals addressed during session: ADL's and self-care      AM-PAC OT "6 Clicks" Daily Activity     Outcome Measure Help from another person eating meals?: None Help from another person taking care of personal grooming?: A Little Help from another person toileting, which includes using toliet, bedpan, or urinal?: A Little Help from another person bathing (including washing, rinsing, drying)?: A Little Help from another person to put on and taking off regular upper body clothing?: A Little Help from another  person to put on and taking off regular lower body clothing?: A Little 6 Click Score: 19   End of Session Equipment Utilized During Treatment: Gait belt Nurse Communication: Mobility status  Activity Tolerance: Patient tolerated treatment well Patient left: in bed;with call bell/phone within reach;with family/visitor present  OT Visit Diagnosis: Unsteadiness on feet (R26.81);Other abnormalities of gait and mobility (R26.89);Muscle weakness (generalized) (M62.81);History of  falling (Z91.81)                Time: [id number] OT Time Calculation (min): 19 min Charges:  OT General Charges $OT Visit: 1 Visit OT Evaluation $OT Eval Moderate Complexity: [place] OTR/L Acute Rehabilitation Services Office: [place] 08/18/2019, 3:24 PM

## 2019-08-18 NOTE — Progress Notes (Signed)
08/18/19 1535  PT Visit Information  Last PT Received On 08/18/19  Assistance Needed +1  PT/OT/SLP Co-Evaluation/Treatment Yes  Reason for Co-Treatment For patient/therapist safety;To address functional/ADL transfers  PT goals addressed during session Mobility/safety with mobility;Balance  History of Present Illness 85 y.o.femalewith multiple medical problems including diabetes, hypertension, vertigo, and CKD. Patient presented to ED with complaints of abdominal pain and progressive abdominal distention.CT abdomen/pelvis showed bowel obstruction in the ascending colon. She is s/p colonic stent  Precautions  Precautions Fall  Restrictions  Weight Bearing Restrictions No  Home Living  Family/patient expects to be discharged to: Private residence  Living Arrangements Other relatives  Available Help at Discharge Family  Type of [place] to enter  Entrance Stairs-Number of Steps 2  Entrance Stairs-Rails Can reach both  [place] One level  Bathroom Shower/Tub Tub/shower unit  Tax adviser - 4 wheels;Cane - single point;Shower seat  Additional Comments Pt plans to d.c to sister's home and then her daughter will pick her up and take her to live at daughter's house in [place]. Daughter has a level entry and single level house with walkin shower and shower seat  Prior Function  Level of Independence Independent  Comments pt was living alone and independent with all ADL and intermittent use of RW  Communication  Communication No difficulties  Pain Assessment  Pain Assessment No/denies pain  Cognition  Arousal/Alertness Awake/alert  Behavior During Therapy WFL for tasks assessed/performed  Overall Cognitive Status Impaired/Different from baseline  Area of Impairment Safety/judgement  Safety/Judgement Decreased awareness of safety;Decreased awareness of deficits  General Comments decreased safety awareness and decreased awareness of  deficits, pt appears to push herself as opposed to taking rest breaks prn  Upper Extremity Assessment  Upper Extremity Assessment Defer to OT evaluation  Lower Extremity Assessment  Lower Extremity Assessment Generalized weakness  Cervical / Trunk Assessment  Cervical / Trunk Assessment Normal  Bed Mobility  Overal bed mobility Needs Assistance  Bed Mobility Supine to Sit  Supine to sit Min assist;HOB elevated  General bed mobility comments required support from therapist to progress trunk upright  Transfers  Overall transfer level Needs assistance  Equipment used None  Transfers Sit to/from Stand  Sit to Stand Min guard;Min assist;+2 safety/equipment  General transfer comment minA for stability in standing  Ambulation/Gait  Ambulation/Gait assistance Min assist;+2 safety/equipment;Min guard  Gait Distance (Feet) 125 Feet  Assistive device Rolling walker (2 wheeled)  Gait Pattern/deviations Step-through pattern;Decreased stride length  General Gait Details Slow, mildly unsteady gait. Occasional LOB noted requiring min A for steadying. Pt notably fatigued following gait training. Pt refusing seated or standing rest, however,   Gait velocity Decreased  Stairs Yes  Stairs assistance Min assist;+2 safety/equipment  Stair Management One rail Right;Step to pattern  Number of Stairs 2  General stair comments Pt requiring min A for steadying during stair navigation. Pt with notable weakness in LE and required increased time to perform. Required safety cues to perform stair navigation at safe speed.   Balance  Overall balance assessment Needs assistance  Sitting-balance support No upper extremity supported;Feet supported  Sitting balance-Leahy Scale Good  Standing balance support No upper extremity supported;During functional activity  Standing balance-Leahy Scale Fair  General Comments  General comments (skin integrity, edema, etc.) pt's sister present during session;pt ambulated in  hallway and practiced stairs, HR up to 121 SpO2 91% following mobiltiy, required pursed lip breathing 15min to return  O2 93%  PT - End of Session  Equipment Utilized During Treatment Gait belt  Activity Tolerance Patient limited by fatigue  Patient left in bed;with call bell/phone within reach;with family/visitor present (sitting EOB )  Nurse Communication Mobility status  PT Assessment  PT Recommendation/Assessment Patient needs continued PT services  PT Visit Diagnosis Unsteadiness on feet (R26.81);Muscle weakness (generalized) (M62.81)  PT Problem List Decreased strength;Decreased mobility;Decreased balance;Decreased knowledge of use of DME;Decreased activity tolerance;Decreased safety awareness  PT Plan  PT Frequency (ACUTE ONLY) Min 3X/week  PT Treatment/Interventions (ACUTE ONLY) DME instruction;Gait training;Functional mobility training;Stair training;Therapeutic activities;Therapeutic exercise;Balance training;Patient/family education  AM-PAC PT "6 Clicks" Mobility Outcome Measure (Version 2)  Help needed turning from your back to your side while in a flat bed without using bedrails? 3  Help needed moving from lying on your back to sitting on the side of a flat bed without using bedrails? 3  Help needed moving to and from a bed to a chair (including a wheelchair)? 3  Help needed standing up from a chair using your arms (e.g., wheelchair or bedside chair)? 3  Help needed to walk in hospital room? 3  Help needed climbing 3-5 steps with a railing?  3  6 Click Score 18  Consider Recommendation of Discharge To: Home with [facility]  PT Recommendation  Follow Up Recommendations Home health PT;Supervision for mobility/OOB  PT equipment None recommended by PT  Individuals Consulted  Consulted and Agree with Results and Recommendations Patient  Acute Rehab PT Goals  Patient Stated Goal to maintain safety with mobiltiy  PT Goal Formulation With patient  Time For Goal Achievement 09/01/19   Potential to Achieve Goals Good  PT Time Calculation  PT Start Time (ACUTE ONLY) 1442  PT Stop Time (ACUTE ONLY) 1501  PT Time Calculation (min) (ACUTE ONLY) 19 min  PT General Charges  $$ ACUTE PT VISIT 1 Visit  PT Evaluation  $PT Eval Moderate Complexity 1 Mod  Written Expression  Dominant Hand Right   Pt admitted secondary to problem above with deficits above. Pt requiring min to min guard A for gait and stair navigation secondary to unsteadiness. Pt with notable fatigue following mobility tasks, and required seated rest and pursed lip breathing for recovery. Oxygen sats from 91-93% on RA. Pt reports she plans to d/c home with sister and then eventually plans to move to daughter's home in [place]. Feel she would benefit from HHPT to increase independence and safety with mobility. Will continue to follow acutely.   Leighton Ruff, PT, DPT  Acute Rehabilitation Services  Pager: [phone] Office: [phone]

## 2019-08-18 NOTE — TOC Transition Note (Signed)
Transition of Care [facility]) - CM/SW Discharge Note Marvetta Gibbons RN, BSN Transitions of Care Unit 4E- RN Case Manager [phone]   Patient Details  Name: Mallory Mcpherson MRN: [id number] Date of Birth: [birth year]  Transition of Care [facility]) CM/SW Contact:  Dawayne Patricia, RN Phone Number: 08/18/2019, 4:40 PM   Clinical Narrative:    Pt stable for transition home today, plan is for pt to go to her sister's home here in [place]. Pt is from [place], then within next 5-7 days pt will plan to go to her daughter's home in [place]. Recommendations for [facility] PT/OT- however pt does not plan to stay here in area and does not have established PCP here- pt and sister have been encouraged to find primary care as soon as possible in [place] for on going medical needs- once established there pt could have [facility] services arranged. Pt will have a one time f/u with IM clinic here prior to going to her daughter's  Spoke with pt and sister at the bedside- per conversation pt reports she already has a doctor lined up in [place]- near [place] where her daughter lives- her and her sister are declining any assistance here or [facility] services. Pt has all needed DME at the sister's home. Sister to provide transport home and meds have been delivered to bedside per [facility] pharmacy.   Final next level of care: [place] Barriers to Discharge: No Barriers Identified   Patient Goals and CMS Choice Patient states their goals for this hospitalization and ongoing recovery are:: to go to daughter's home in [place]   Choice offered to / list presented to : Patient, Sibling  Discharge Placement  Home                     Discharge Plan and Services   Discharge Planning Services: CM Consult Post Acute Care Choice: Home Health                               Social Determinants of Health (SDOH) Interventions     Readmission Risk Interventions No flowsheet data found.

## 2019-08-19 NOTE — Discharge Summary (Signed)
Name: Mallory Mcpherson MRN: [id number] DOB: 1929/01/02 85 y.o. PCP: System, Pcp Not In  Date of Admission: 08/13/2019 10:55 AM Date of Discharge: 08/18/2019 Attending Physician: Aldine Contes, MD  Discharge Diagnosis:  1.  Large bowel obstruction secondary to 5 mm ulcerating mass 2. Anemia   Discharge Medications: Allergies as of 08/18/2019   No Known Allergies     Medication List    STOP taking these medications   aspirin EC 81 MG tablet   cephALEXin 500 MG capsule Commonly known as: KEFLEX     TAKE these medications   enalapril 10 MG tablet Commonly known as: VASOTEC Take 10 mg by mouth at bedtime.   glimepiride 2 MG tablet Commonly known as: AMARYL Take 2 mg by mouth 2 (two) times daily.   meclizine 25 MG tablet Commonly known as: ANTIVERT Take 25 mg by mouth 3 (three) times daily as needed for dizziness.   multivitamin with minerals Tabs tablet Take 1 tablet by mouth daily.   ondansetron 4 MG tablet Commonly known as: ZOFRAN Take 1 tablet (4 mg total) by mouth every 6 (six) hours as needed for nausea.   oxyCODONE-acetaminophen 5-325 MG tablet Commonly known as: PERCOCET/ROXICET Take 1 tablet by mouth every 6 (six) hours as needed for up to 5 days for moderate pain or severe pain.   pantoprazole 40 MG tablet Commonly known as: PROTONIX Take 1 tablet (40 mg total) by mouth daily.   simvastatin 40 MG tablet Commonly known as: ZOCOR Take 40 mg by mouth daily.   sitaGLIPtin 25 MG tablet Commonly known as: JANUVIA Take 25 mg by mouth daily.   VITAMIN D3 PO Take 1 tablet by mouth daily.      Disposition and follow-up:   Mallory Mcpherson was discharged from [facility] in [place] condition.  At the hospital follow up visit please address:  1.  Large bowel obstruction secondary to 5 mm ulcerating mass: Mass found on colonoscopy at the hepatic flexure causing bowel obstruction. Stent was placed by GI with resolution of obstruction.  Patient was able to tolerate p.o. and had bowel movements. Patient not interested in chemotherapy or surgical interventions at this time. Discharged home with a sister who lives in [place], who will ensure she follows up and transitions to [place] to live with her daughter ultimately. - Percocet 5 mg every 6 hours PRN for 5 days. (20 pills total) - Patient wishes to follow-up with palliative care in [place] when she moves there with her daughter after her follow-up appointment in [place].  2. Anemia: Hemoglobin down trended from 9.3 to 7.3 during admission.  Please follow-up hemoglobin counts to ensure patient is not having an active bleed.  2.  Labs / imaging needed at time of follow-up: CBC, Hgb 7.3 on discharge  3.  Pending labs/ test needing follow-up: none  Follow-up Appointments: Follow-up Information    Ralene Ok, MD Follow up.   Specialty: General Surgery Why: call if you want to discuss surgery Contact information: [address] [place]  [address] [phone]          Hospital Course by problem list: 1.  Large bowel obstruction secondary to 5 mm ulcerating mass 2. Anemia   In summary, Mallory Mcpherson is a 85 year old female with a history notable for CKD, T2DM, HTN and vertigo who presented with nausea, abdominal pain, and distention. Imaging was notable for moderate grade obstruction of the ascending colon with a distinct transition point.   The  patient was initially on an NG tube was n.p.o. The patient underwent successful stent placement via colonoscopy with GI.  Patient was able to resume bowel movements and advance her diet.  During the colonoscopy, a 5cm ulcerating mass was seen partially obstructing the bowel at the hepatic flexure, likely a malignant tumor. The patient was informed of this and stated that she did not want surgical intervention would rather focus on comfort moving forward. Palliative care got involved with the patient and she decided  she wanted to go home with her sister who lives in [place].  She will follow-up in the outpatient internal medicine clinic to make sure her blood counts are stable and plan to move to [facility] to live with her daughter.  Of note, the patient's hemoglobin down trended from 9.3 to 7.3 prior to discharge.  She will need this followed up at outpatient follow-up visit.  Discharge Vitals:   BP 137/72 (BP Location: Left Arm)   Pulse 92   Temp 98.4 F (36.9 C) (Oral)   Resp (!) 25   Ht 5\' 3"  (1.6 m)   Wt 86.2 kg   SpO2 95%   BMI 33.66 kg/m   Pertinent Labs, Studies, and Procedures:  CBC Latest Ref Rng & Units 08/18/2019 08/17/2019 08/16/2019  WBC 4.0 - 10.5 K/uL 6.1 4.1 4.4  Hemoglobin 12.0 - 15.0 g/dL 7.3(L) 7.7(L) 8.3(L)  Hematocrit 36.0 - 46.0 % 24.9(L) 26.8(L) 28.7(L)  Platelets 150 - 400 K/uL 282 282 288   BMP Latest Ref Rng & Units 08/18/2019 08/17/2019 08/16/2019  Glucose 70 - 99 mg/dL 113(H) 59(L) 70  BUN 8 - 23 mg/dL 29(H) 42(H) 50(H)  Creatinine 0.44 - 1.00 mg/dL 1.40(H) 1.65(H) 1.94(H)  Sodium 135 - 145 mmol/L 136 138 139  Potassium 3.5 - 5.1 mmol/L 4.0 4.0 4.1  Chloride 98 - 111 mmol/L 104 105 106  CO2 22 - 32 mmol/L 23 25 25   Calcium 8.9 - 10.3 mg/dL 7.5(L) 7.4(L) 7.6(L)   CT Abdomen: IMPRESSION: 1. Moderate grade obstruction at the level of the ascending colon with a distinct abrupt transition point as detailed above. This further results in dilatation of the distal small bowel. An exact cause of this abrupt transition point and obstruction is not identified. An underlying mass is not excluded. Follow-up is recommended. 2. There is a complex 4.9 x 3.8 cm mass arising from the upper pole of the right kidney. This could represent a hemorrhagic/proteinaceous cyst versus a solid mass. Further evaluation with nonemergent MRI with and without contrast is recommended. 3. Sigmoid diverticulosis without CT evidence for diverticulitis. 4. Trace amount of free fluid in the right  pericolic gutter. No free air. 5. No hydronephrosis. No radiopaque kidney stones.  Colonoscopy: An ulcerated partially obstructing small mass was found at the hepatic flexure. The mass was partially circumferential. In addition, its diameter measured approximately five mm. No bleeding was present. This was stented with a 22 mm x 9 cm WallFlex stent.   Discharge Instructions: Discharge Instructions    Call MD for:   Complete by: As directed    Call MD for:  difficulty breathing, headache or visual disturbances   Complete by: As directed    Call MD for:  extreme fatigue   Complete by: As directed    Call MD for:  hives   Complete by: As directed    Call MD for:  persistant dizziness or light-headedness   Complete by: As directed    Call MD for:  persistant nausea  and vomiting   Complete by: As directed    Call MD for:  severe uncontrolled pain   Complete by: As directed    Call MD for:  temperature >100.4   Complete by: As directed    Diet - low sodium heart healthy   Complete by: As directed    Discharge instructions   Complete by: As directed    Thank you for allowing [place] to take care of you during your hospitalization.  Below is a summary of what we treated:  1.  Bowel obstruction: The GI doctors placed a stent in your large bowel to relieve the obstruction.  However a mass was found and is likely cancer.  We understand you do not want any surgical or chemotherapeutic interventions at this time and would like to focus on comfort. - You may take one Percocet 5 mg tablet every 6 hours as needed for pain. One of the side effects of this medication is constipation, so please only take this medication if you need it.   2.  Anemia: Your blood counts were low.  You may have a small bleed that is leading to the decreased blood counts.  It will be important for you to follow-up with a primary care provider to to recheck your blood counts when she leave the hospital. -We scheduled you a  follow-up appointment on Friday, September 25 at 10:15 AM at the internal medicine clinic here in [place].  If you have any questions or concerns, please feel free to reach out to [place].   Increase activity slowly   Complete by: As directed      Signed: Earlene Plater, MD Internal Medicine, PGY1 Pager: [phone]  08/19/2019,4:39 PM

## 2019-08-20 ENCOUNTER — Ambulatory Visit: Payer: Medicare Other

## 2021-06-25 DEATH — deceased

## 2021-09-20 IMAGING — RF DG ABDOMEN 2V
1 series · 2 of 2 positions shown · non-contrast
Comparison: One-view abdomen 08/16/2019

CLINICAL DATA: Colonic stent placement.

EXAM:
ABDOMEN - 2 VIEW; DG C-ARM 1-60 MIN

[Series 1: unknown protocol · 0.20mm/px · 2 of 2 slices shown]
[im 1/2]
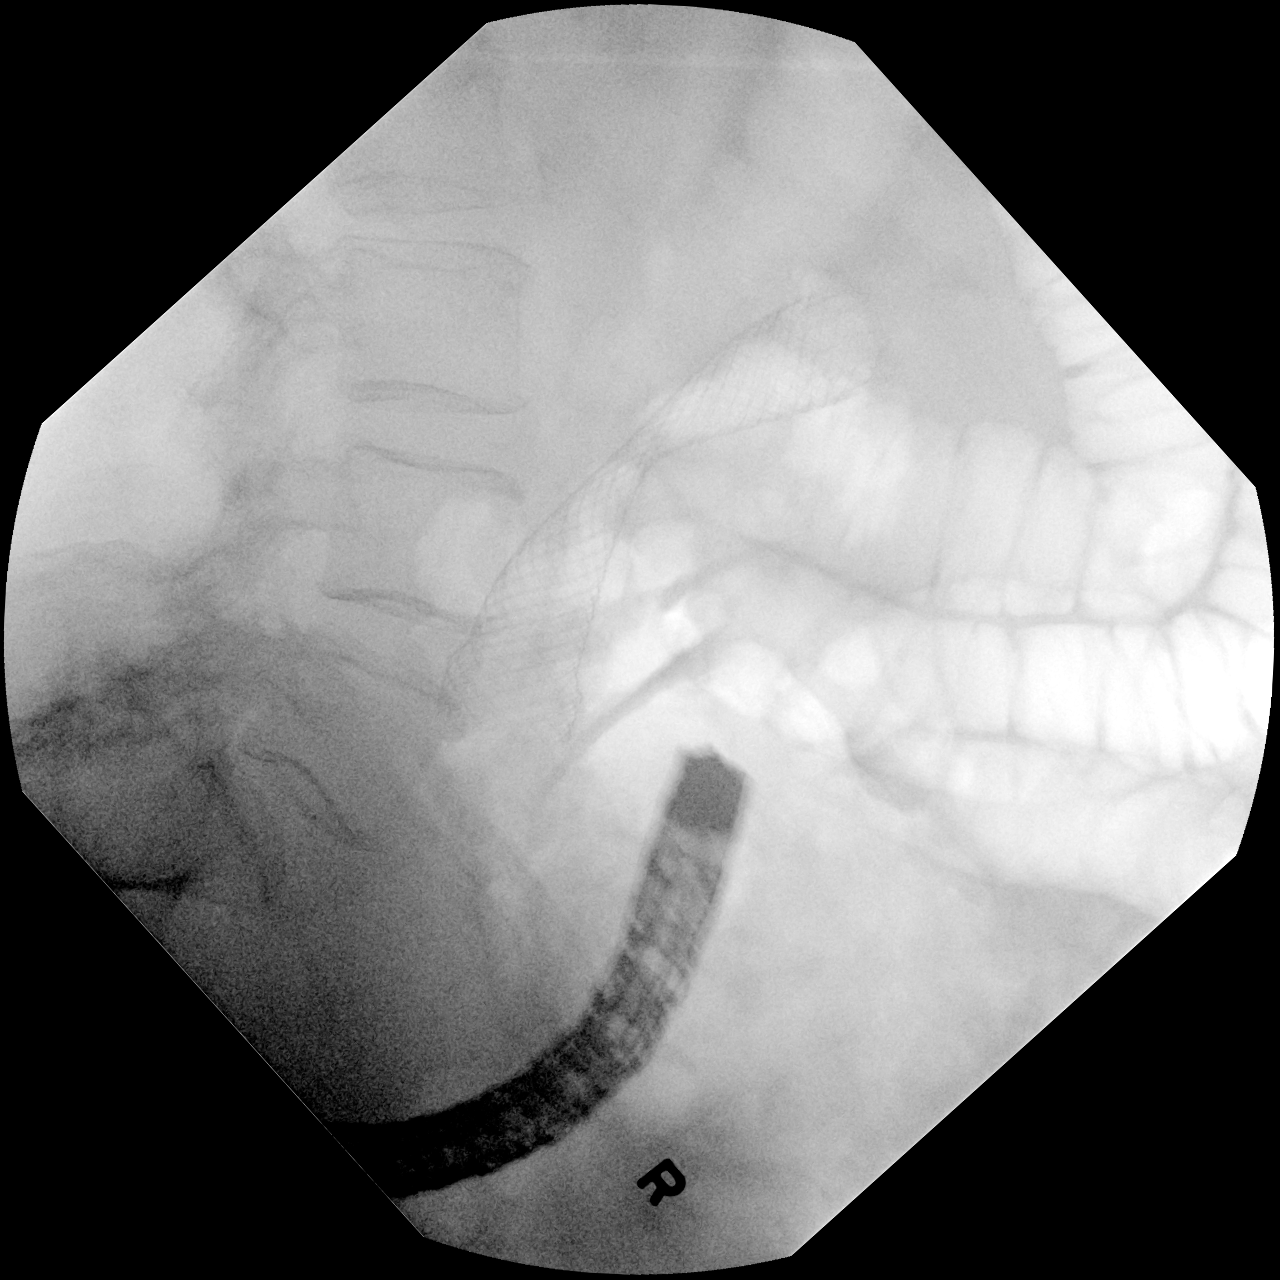
[im 2/2]
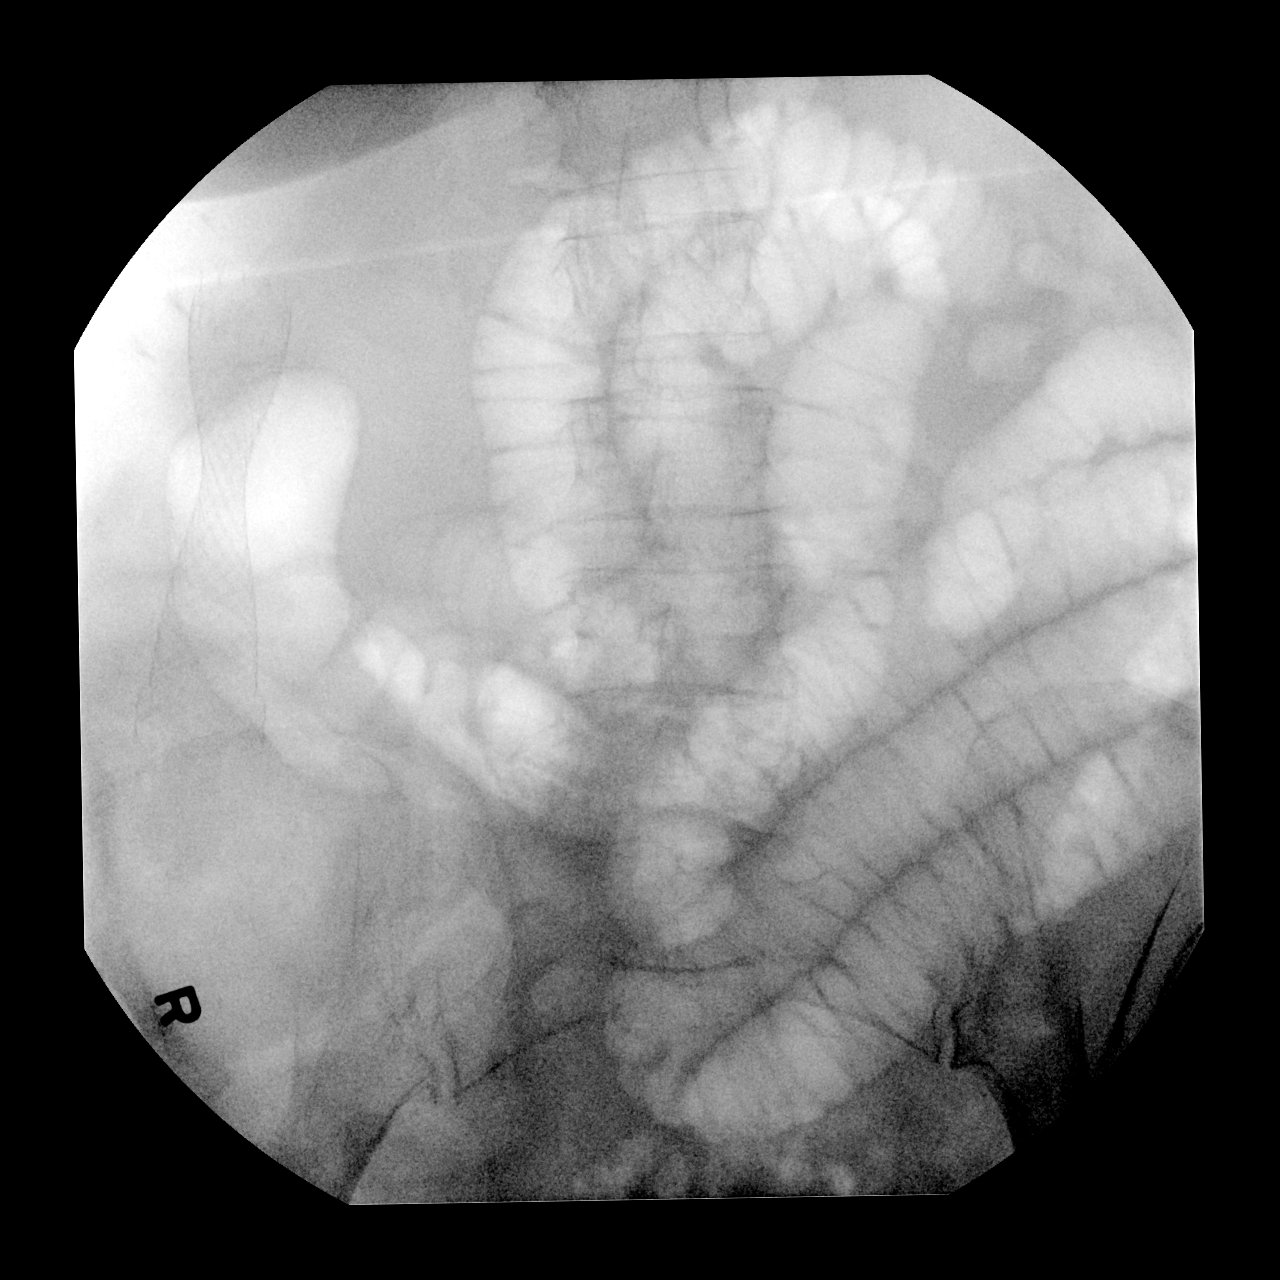

[2 of 2 positions shown; findings below may reference images not displayed]

FINDINGS: Intraoperative imaging demonstrates a stent in the right lower
quadrant. Stent is narrowed centrally. Dilated loops of small bowel
remain.
IMPRESSION: 1. Interval placement of proximal colonic stent.
2. Persistent small bowel obstruction.

## 2021-09-21 IMAGING — DX DG ABDOMEN 2V
2 series · 2 of 2 positions shown · non-contrast
Comparison: CT scan dated 08/13/2019 and abdominal radiograph dated
08/16/2019

CLINICAL DATA: Proximal colon obstruction.

EXAM:
ABDOMEN - 2 VIEW

[abdomen erect]
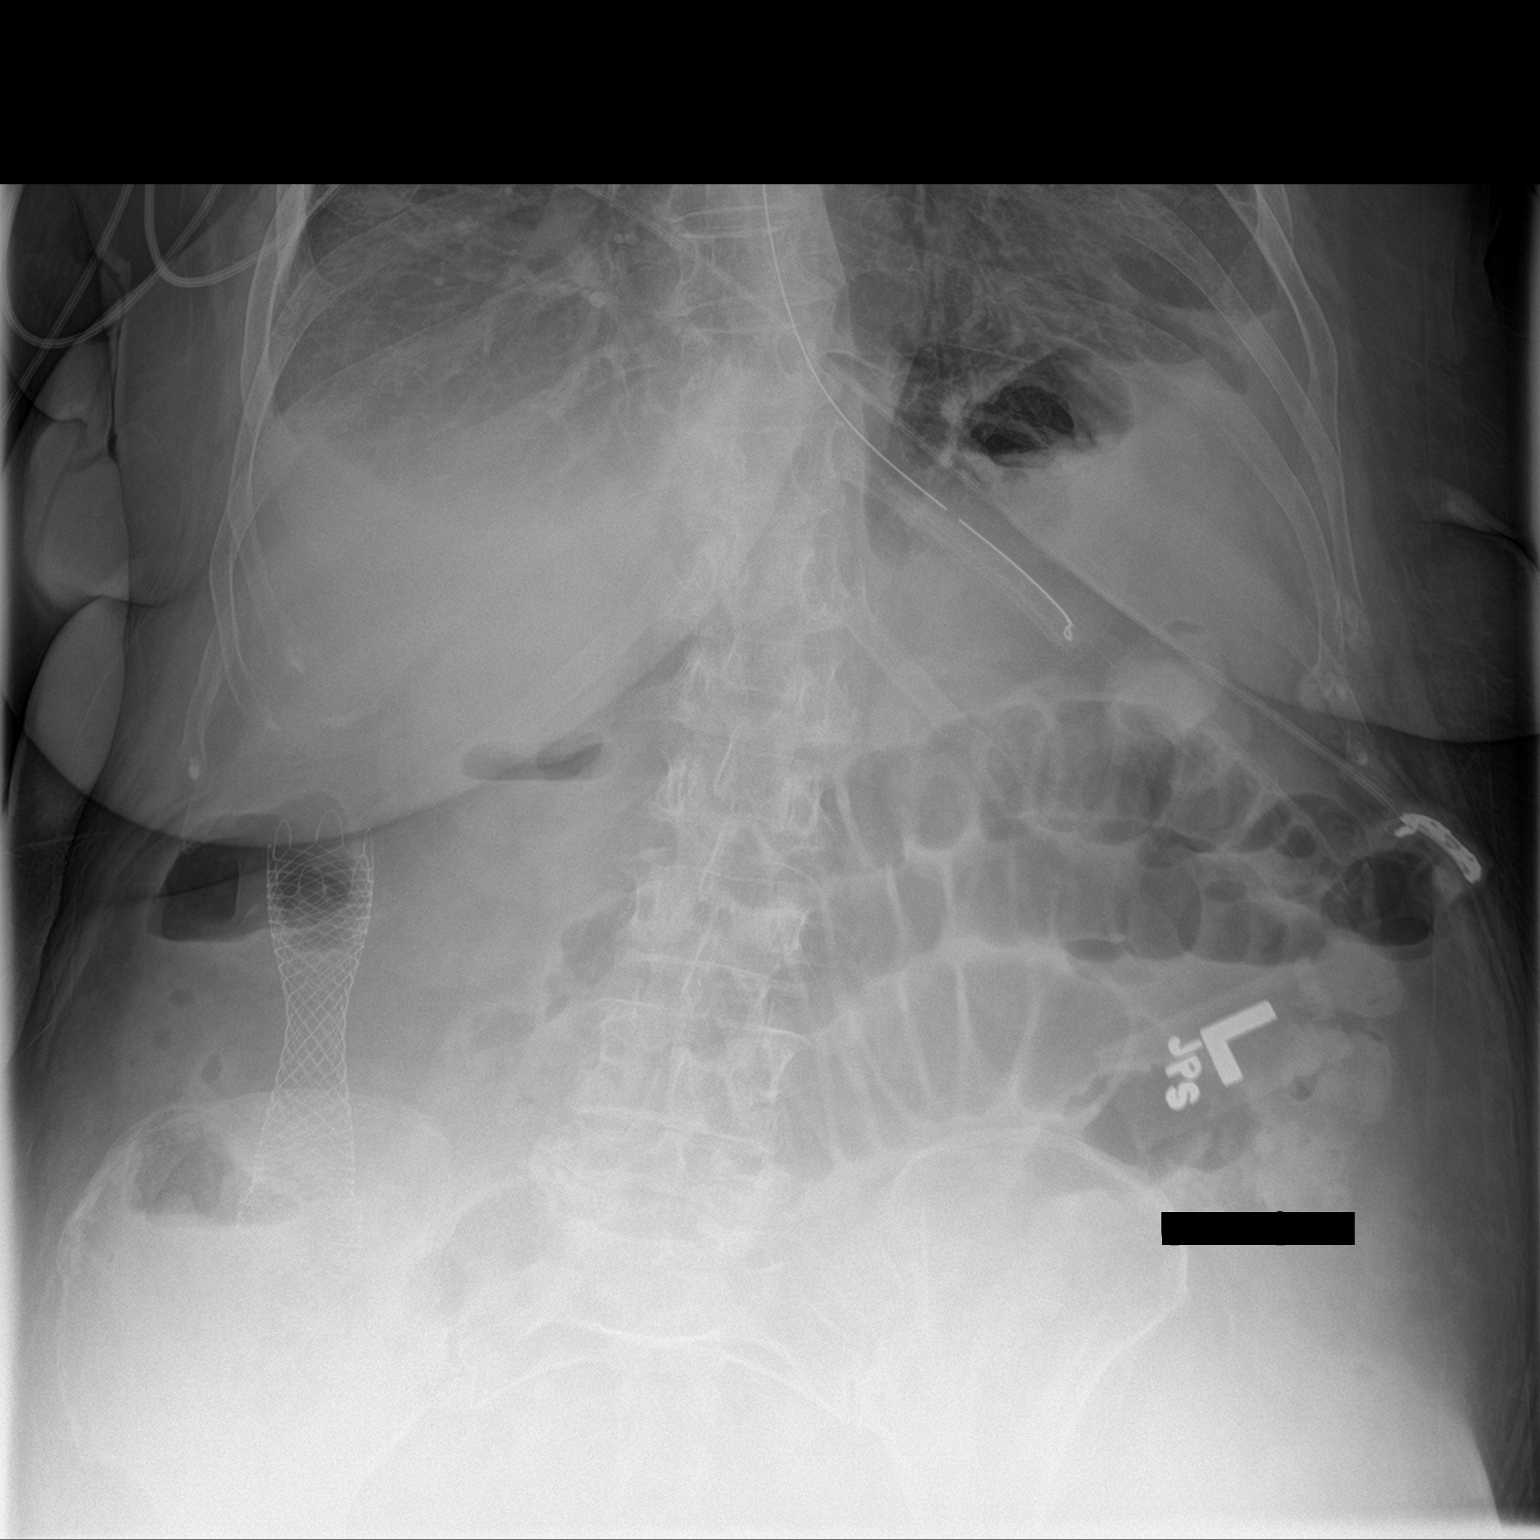

[abdomen supine]
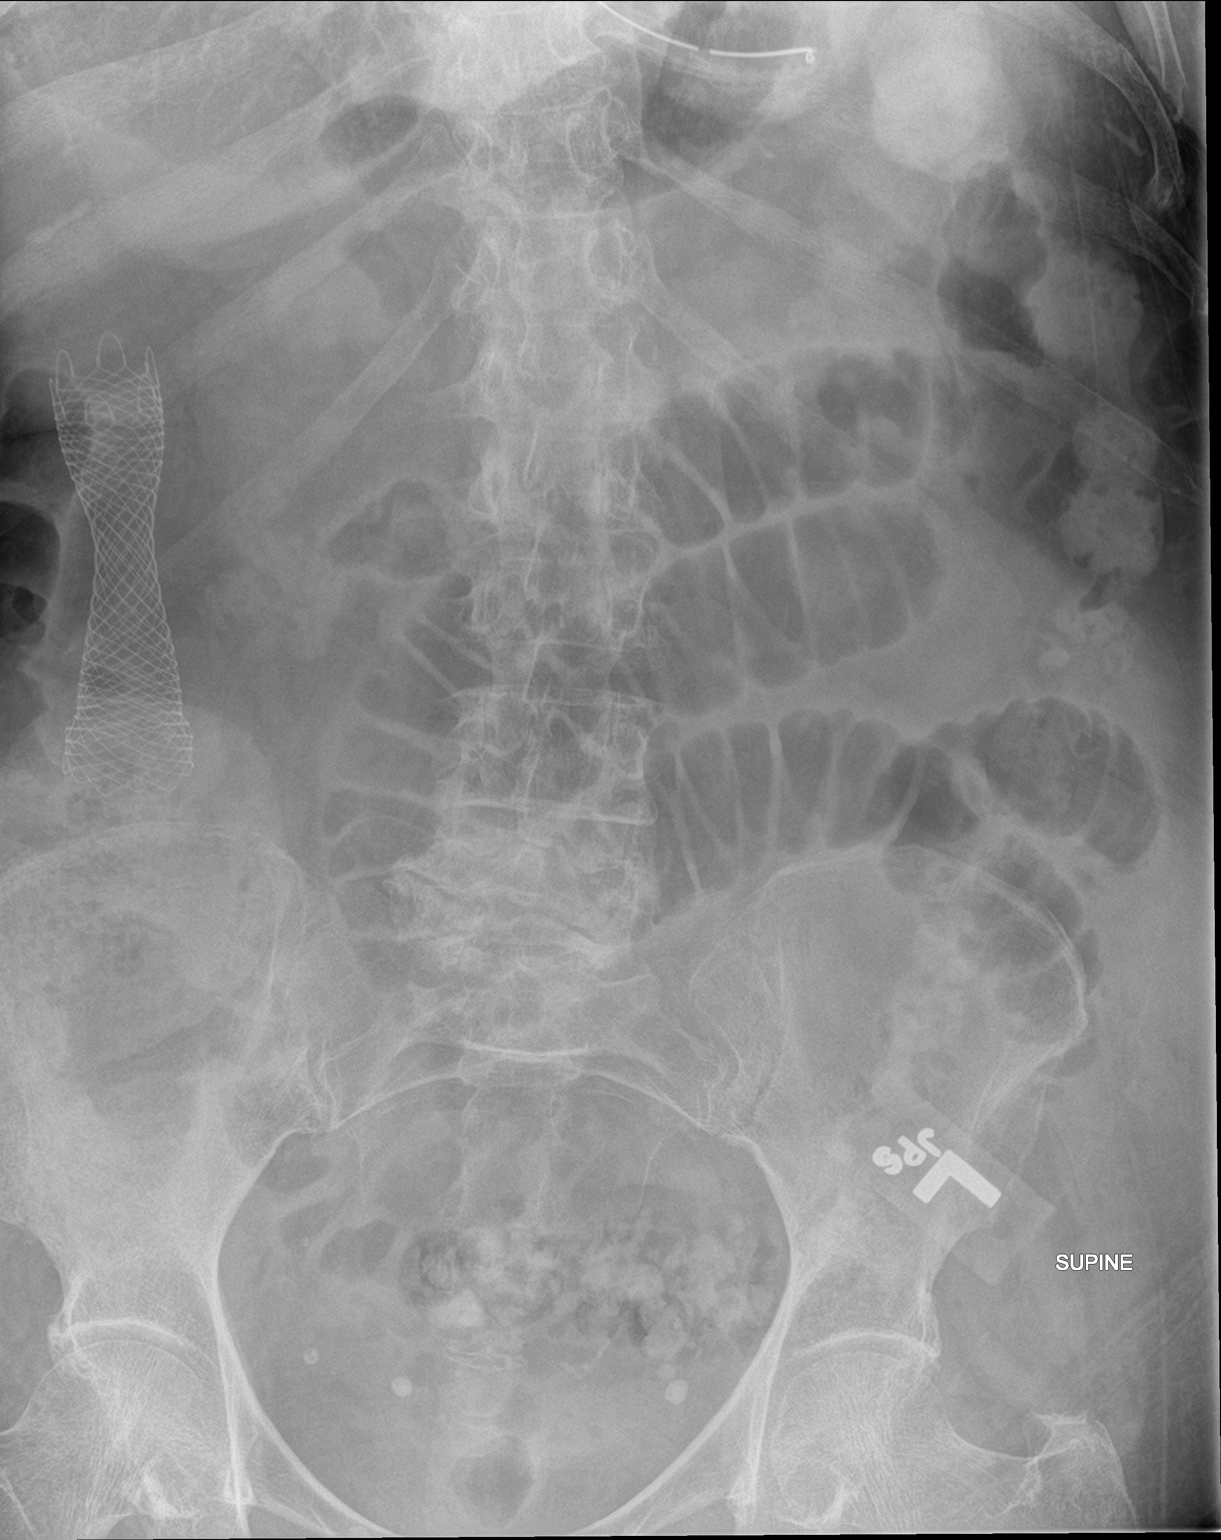

[2 of 2 positions shown; findings below may reference images not displayed]

FINDINGS: NG tube tip is in the body of the stomach. Stent is visible in the
distal ascending colon. There has been a decrease in the number of
distended small bowel loops. Colon is not distended.
IMPRESSION: Improving proximal colon obstruction. Stent in the distal ascending
colon. No free air in the abdomen.
